# Patient Record
Sex: Female | Born: 1950 | Race: White | Hispanic: No | Smoking: Never smoker
Health system: Southern US, Community
[De-identification: ages and names within clinical notes are randomized; demographics above are authoritative.]

## PROBLEM LIST (undated history)

## (undated) DIAGNOSIS — E7800 Pure hypercholesterolemia, unspecified: Secondary | ICD-10-CM

## (undated) DIAGNOSIS — D649 Anemia, unspecified: Secondary | ICD-10-CM

## (undated) DIAGNOSIS — K635 Polyp of colon: Secondary | ICD-10-CM

## (undated) DIAGNOSIS — M8580 Other specified disorders of bone density and structure, unspecified site: Secondary | ICD-10-CM

## (undated) DIAGNOSIS — Z87442 Personal history of urinary calculi: Secondary | ICD-10-CM

## (undated) DIAGNOSIS — Z780 Asymptomatic menopausal state: Secondary | ICD-10-CM

## (undated) DIAGNOSIS — F419 Anxiety disorder, unspecified: Secondary | ICD-10-CM

## (undated) DIAGNOSIS — N200 Calculus of kidney: Secondary | ICD-10-CM

## (undated) DIAGNOSIS — M1990 Unspecified osteoarthritis, unspecified site: Secondary | ICD-10-CM

## (undated) DIAGNOSIS — F32A Depression, unspecified: Secondary | ICD-10-CM

## (undated) DIAGNOSIS — G252 Other specified forms of tremor: Secondary | ICD-10-CM

## (undated) HISTORY — PX: COLONOSCOPY: SHX174

## (undated) HISTORY — DX: Unspecified osteoarthritis, unspecified site: M19.90

## (undated) HISTORY — PX: LEG SURGERY: SHX1003

## (undated) HISTORY — DX: Asymptomatic menopausal state: Z78.0

## (undated) HISTORY — DX: Other specified disorders of bone density and structure, unspecified site: M85.80

## (undated) HISTORY — DX: Polyp of colon: K63.5

## (undated) HISTORY — DX: Pure hypercholesterolemia, unspecified: E78.00

## (undated) HISTORY — DX: Other specified forms of tremor: G25.2

## (undated) HISTORY — DX: Calculus of kidney: N20.0

---

## 1998-08-14 ENCOUNTER — Other Ambulatory Visit: Admission: RE | Admit: 1998-08-14 | Discharge: 1998-08-14 | Payer: Self-pay | Admitting: Gynecology

## 1999-08-15 ENCOUNTER — Other Ambulatory Visit: Admission: RE | Admit: 1999-08-15 | Discharge: 1999-08-15 | Payer: Self-pay | Admitting: Gynecology

## 2000-09-10 ENCOUNTER — Other Ambulatory Visit: Admission: RE | Admit: 2000-09-10 | Discharge: 2000-09-10 | Payer: Self-pay | Admitting: Gynecology

## 2001-09-08 ENCOUNTER — Other Ambulatory Visit: Admission: RE | Admit: 2001-09-08 | Discharge: 2001-09-08 | Payer: Self-pay | Admitting: Gynecology

## 2002-09-22 ENCOUNTER — Other Ambulatory Visit: Admission: RE | Admit: 2002-09-22 | Discharge: 2002-09-22 | Payer: Self-pay | Admitting: Gynecology

## 2003-09-13 ENCOUNTER — Other Ambulatory Visit: Admission: RE | Admit: 2003-09-13 | Discharge: 2003-09-13 | Payer: Self-pay | Admitting: Gynecology

## 2004-09-16 ENCOUNTER — Other Ambulatory Visit: Admission: RE | Admit: 2004-09-16 | Discharge: 2004-09-16 | Payer: Self-pay | Admitting: Gynecology

## 2005-09-18 ENCOUNTER — Other Ambulatory Visit: Admission: RE | Admit: 2005-09-18 | Discharge: 2005-09-18 | Payer: Self-pay | Admitting: Gynecology

## 2006-09-29 ENCOUNTER — Other Ambulatory Visit: Admission: RE | Admit: 2006-09-29 | Discharge: 2006-09-29 | Payer: Self-pay | Admitting: Gynecology

## 2007-10-20 ENCOUNTER — Other Ambulatory Visit: Admission: RE | Admit: 2007-10-20 | Discharge: 2007-10-20 | Payer: Self-pay | Admitting: Gynecology

## 2010-09-15 ENCOUNTER — Encounter: Payer: Self-pay | Admitting: Endocrinology

## 2010-12-09 ENCOUNTER — Ambulatory Visit: Payer: Self-pay | Admitting: Internal Medicine

## 2010-12-23 ENCOUNTER — Other Ambulatory Visit: Payer: Self-pay

## 2010-12-23 ENCOUNTER — Ambulatory Visit (HOSPITAL_BASED_OUTPATIENT_CLINIC_OR_DEPARTMENT_OTHER)
Admission: RE | Admit: 2010-12-23 | Discharge: 2010-12-23 | Disposition: A | Discharge status: Home / Self Care | Payer: BC Managed Care – PPO | Source: Ambulatory Visit | Attending: Internal Medicine | Admitting: Internal Medicine

## 2010-12-23 ENCOUNTER — Other Ambulatory Visit: Payer: Self-pay | Admitting: Internal Medicine

## 2010-12-23 ENCOUNTER — Ambulatory Visit (INDEPENDENT_AMBULATORY_CARE_PROVIDER_SITE_OTHER): Payer: BC Managed Care – PPO | Admitting: Internal Medicine

## 2010-12-23 DIAGNOSIS — M949 Disorder of cartilage, unspecified: Secondary | ICD-10-CM

## 2010-12-23 DIAGNOSIS — R011 Cardiac murmur, unspecified: Secondary | ICD-10-CM

## 2010-12-23 DIAGNOSIS — M25551 Pain in right hip: Secondary | ICD-10-CM

## 2010-12-23 DIAGNOSIS — Z01419 Encounter for gynecological examination (general) (routine) without abnormal findings: Secondary | ICD-10-CM

## 2010-12-23 DIAGNOSIS — M25559 Pain in unspecified hip: Secondary | ICD-10-CM

## 2010-12-23 DIAGNOSIS — Z113 Encounter for screening for infections with a predominantly sexual mode of transmission: Secondary | ICD-10-CM

## 2010-12-23 DIAGNOSIS — M899 Disorder of bone, unspecified: Secondary | ICD-10-CM

## 2010-12-23 DIAGNOSIS — Z1272 Encounter for screening for malignant neoplasm of vagina: Secondary | ICD-10-CM

## 2011-01-09 ENCOUNTER — Ambulatory Visit (INDEPENDENT_AMBULATORY_CARE_PROVIDER_SITE_OTHER): Payer: BC Managed Care – PPO | Admitting: Internal Medicine

## 2011-01-09 DIAGNOSIS — R011 Cardiac murmur, unspecified: Secondary | ICD-10-CM

## 2011-01-09 DIAGNOSIS — Z23 Encounter for immunization: Secondary | ICD-10-CM

## 2011-01-09 DIAGNOSIS — M159 Polyosteoarthritis, unspecified: Secondary | ICD-10-CM

## 2011-01-09 DIAGNOSIS — E785 Hyperlipidemia, unspecified: Secondary | ICD-10-CM

## 2011-01-15 ENCOUNTER — Ambulatory Visit (HOSPITAL_COMMUNITY)
Admission: RE | Admit: 2011-01-15 | Discharge: 2011-01-15 | Disposition: A | Discharge status: Home / Self Care | Payer: BC Managed Care – PPO | Source: Ambulatory Visit | Attending: Internal Medicine | Admitting: Internal Medicine

## 2011-01-15 DIAGNOSIS — R011 Cardiac murmur, unspecified: Secondary | ICD-10-CM

## 2011-01-15 DIAGNOSIS — I079 Rheumatic tricuspid valve disease, unspecified: Secondary | ICD-10-CM | POA: Insufficient documentation

## 2011-01-15 DIAGNOSIS — I059 Rheumatic mitral valve disease, unspecified: Secondary | ICD-10-CM | POA: Insufficient documentation

## 2011-01-16 ENCOUNTER — Encounter: Payer: Self-pay | Admitting: Internal Medicine

## 2011-01-22 ENCOUNTER — Ambulatory Visit: Payer: BC Managed Care – PPO | Admitting: Physical Therapy

## 2011-03-10 ENCOUNTER — Telehealth: Payer: Self-pay | Admitting: Emergency Medicine

## 2011-03-10 NOTE — Telephone Encounter (Signed)
Spoke with pt.  She states that she was instructed to begin the process of coming off her hormones.  She states that she is currently on 1/8 of the the Vivelle Dot and has been since 6/15.  She states that she has continued to take Prometrium on same schedule.  She wonders if/when she is supposed to stop taking it.  Per DDS, when pt is off Vivelle Dot, she is to stop Prometrium.  When I explained this to [facility], she states that she is already having problematic hot flashes, 12-15 per day, including at night making sleeping difficult.  She states that she thought DDS mentioned something about putting her on "organic, natural hormone"?  Aware per DDS, she needs to make appt to discuss hormones, side effects and the goal of being off hormones completely.  Pt was slightly reluctant to make appt, but did agree to appt tomorrow morning at 11am.

## 2011-03-11 ENCOUNTER — Encounter: Payer: Self-pay | Admitting: Internal Medicine

## 2011-03-11 ENCOUNTER — Ambulatory Visit (INDEPENDENT_AMBULATORY_CARE_PROVIDER_SITE_OTHER): Payer: BC Managed Care – PPO | Admitting: Internal Medicine

## 2011-03-11 ENCOUNTER — Other Ambulatory Visit: Payer: Self-pay | Admitting: Emergency Medicine

## 2011-03-11 VITALS — BP 100/60 | HR 64 | Temp 97.6°F | Resp 12 | Ht 61.0 in | Wt 130.0 lb

## 2011-03-11 DIAGNOSIS — M129 Arthropathy, unspecified: Secondary | ICD-10-CM

## 2011-03-11 DIAGNOSIS — E559 Vitamin D deficiency, unspecified: Secondary | ICD-10-CM

## 2011-03-11 DIAGNOSIS — R251 Tremor, unspecified: Secondary | ICD-10-CM

## 2011-03-11 DIAGNOSIS — M899 Disorder of bone, unspecified: Secondary | ICD-10-CM

## 2011-03-11 DIAGNOSIS — M8580 Other specified disorders of bone density and structure, unspecified site: Secondary | ICD-10-CM

## 2011-03-11 DIAGNOSIS — E785 Hyperlipidemia, unspecified: Secondary | ICD-10-CM

## 2011-03-11 DIAGNOSIS — R259 Unspecified abnormal involuntary movements: Secondary | ICD-10-CM

## 2011-03-11 DIAGNOSIS — Z780 Asymptomatic menopausal state: Secondary | ICD-10-CM

## 2011-03-11 DIAGNOSIS — M1990 Unspecified osteoarthritis, unspecified site: Secondary | ICD-10-CM

## 2011-03-11 DIAGNOSIS — K635 Polyp of colon: Secondary | ICD-10-CM

## 2011-03-11 DIAGNOSIS — D126 Benign neoplasm of colon, unspecified: Secondary | ICD-10-CM

## 2011-03-11 DIAGNOSIS — N951 Menopausal and female climacteric states: Secondary | ICD-10-CM

## 2011-03-11 MED ORDER — VENLAFAXINE HCL 75 MG PO TABS: 75.0000 mg | ORAL_TABLET | Freq: Every day | ORAL | Status: DC

## 2011-03-11 NOTE — Patient Instructions (Signed)
Take med with food.  Call if any problems

## 2011-03-12 ENCOUNTER — Encounter: Payer: Self-pay | Admitting: Internal Medicine

## 2011-03-12 ENCOUNTER — Telehealth: Payer: Self-pay | Admitting: Internal Medicine

## 2011-03-12 ENCOUNTER — Telehealth: Payer: Self-pay | Admitting: Emergency Medicine

## 2011-03-12 DIAGNOSIS — E559 Vitamin D deficiency, unspecified: Secondary | ICD-10-CM | POA: Insufficient documentation

## 2011-03-12 DIAGNOSIS — Z780 Asymptomatic menopausal state: Secondary | ICD-10-CM | POA: Insufficient documentation

## 2011-03-12 DIAGNOSIS — M8580 Other specified disorders of bone density and structure, unspecified site: Secondary | ICD-10-CM | POA: Insufficient documentation

## 2011-03-12 DIAGNOSIS — M1990 Unspecified osteoarthritis, unspecified site: Secondary | ICD-10-CM | POA: Insufficient documentation

## 2011-03-12 DIAGNOSIS — E785 Hyperlipidemia, unspecified: Secondary | ICD-10-CM | POA: Insufficient documentation

## 2011-03-12 DIAGNOSIS — K635 Polyp of colon: Secondary | ICD-10-CM | POA: Insufficient documentation

## 2011-03-12 DIAGNOSIS — R251 Tremor, unspecified: Secondary | ICD-10-CM | POA: Insufficient documentation

## 2011-03-12 NOTE — Telephone Encounter (Signed)
Spoke with pt..  She is feeling better now but does not want Effexor.  I counseled she could try OTC Remifemin 40 mg for 3 days then 80 mg daily for 4-6 months.  If she starts OTC med she is to come to office for LFT"S  6-8 weeks after starting medication.  Pt voices understanding

## 2011-03-12 NOTE — Telephone Encounter (Signed)
I spoke with pt.  She can try otc Remifemin but if she takes daily must have her LFT"S checked in 6-8 weeks.  She voices understanding.  Gavin Pound  Has she had an updated mammogram?  Chart has one from 2010.  Please call pt and she if she scheduled one.  [facility] did her last one  Message back thanks

## 2011-03-12 NOTE — Telephone Encounter (Signed)
Spoke with pt.  She reports what she believes to be a reaction to venlafaxine.  She took first dose last night around 730pm.  She states this morning, she feels like she has "been drinking too much for several days", she is nauseated, dizzy.  She has "jerking" in her legs and feet, blurred vision and her skin feels "prickly".  She denies any rash or fevers, no itching.  She is not having any difficulty breathing.  She states she does not wish to take any more venlafaxine, no more hormones.  She would like to try to "come off all the medication" and "deal with the hot flashes as best I can for as long as I can".  She is aware I will let DDS know and see if she has any further recommendations.

## 2011-03-12 NOTE — Progress Notes (Signed)
  Subjective:    Patient ID: Alexis Holmes, female    DOB: 07-11-1951, 60 y.o.   MRN: [id number]  HPI  Alexis Holmes is here to discuss alternatives to hormones as she is having more hot flushes as she tapers her Vivelle Dot.  She is still on the prometrium.  She  Nursing visit note.  She is cautious and "does not like to take meds."  Night  And day flushes  She also is asking about exercise and its effect on her arthritis.  She does Jazzercise several times a week and likes it.  She does not experience discomfort 24-48hours later in her joints   No Known Allergies Past Medical History  Diagnosis Date  . Hypercholesterolemia   . Vitamin D deficiency   . Intention tremor     Right  . Osteopenia   . Menopause   . Colon polyps   . Arthritis    Past Surgical History  Procedure Date  . Leg surgery 1988-1990    s/p fracture   History   Social History  . Marital Status: Divorced    Spouse Name: N/A    Number of Children: 0  . Years of Education: Assoc deg   Occupational History  . [employer]    Social History Main Topics  . Smoking status: Never Smoker   . Smokeless tobacco: Never Used  . Alcohol Use: No  . Drug Use: No  . Sexually Active: Yes -- Female partner(s)   Other Topics Concern  . Not on file   Social History Narrative  . No narrative on file   Family History  Problem Relation Age of Onset  . Fibromyalgia Brother   . Diabetes Maternal Grandfather   . Breast cancer Maternal Aunt    There is no problem list on file for this patient.      Review of Systems  No rashes, no report of vaginal dryness.  See HPI     Objective:   Physical Exam  Constitutional: She is oriented to person, place, and time. She appears well-developed and well-nourished.  Cardiovascular: Normal rate and regular rhythm.  Exam reveals no gallop and no friction rub.   No murmur heard.      No LE edema  Pulmonary/Chest: Breath sounds normal. She has no wheezes. She has no rales. She  exhibits no tenderness.  Neurological: She is alert and oriented to person, place, and time.  Skin: Skin is warm and dry.  Psychiatric: She has a normal mood and affect. Her behavior is normal.          Assessment & Plan:   1)  Symptomatic menopause:  I counseled for 30 mins regarding options of Effexor or Clonidine by prescription or OTC Remifemin for a few months.  She wishes to try a short course of Effexor.  She is aware of neurologic and GI, and sexual side effects.  She is to call if any problems.  2)  Arthritis:  Encourage low impact exercise for her joints.  She can use Voltaren gel prn See problem list

## 2011-03-13 NOTE — Telephone Encounter (Signed)
Spoke with pt.  She states that she is feeling much better today.  She states symptoms have significantly improved, not completely resolved.  She still has some minor dizziness, but much better than yesterday.  Reminded patient she is due for mammogram next month, she states she will schedule.

## 2011-03-13 NOTE — Telephone Encounter (Signed)
Chart shows mammogram due 8/12.  [facility] does not have a pending appt.

## 2011-04-15 ENCOUNTER — Inpatient Hospital Stay (INDEPENDENT_AMBULATORY_CARE_PROVIDER_SITE_OTHER)
Admission: RE | Admit: 2011-04-15 | Discharge: 2011-04-15 | Disposition: A | Discharge status: Home / Self Care | Payer: BC Managed Care – PPO | Source: Ambulatory Visit | Attending: Emergency Medicine | Admitting: Emergency Medicine

## 2011-04-15 ENCOUNTER — Encounter: Payer: Self-pay | Admitting: Emergency Medicine

## 2011-04-15 DIAGNOSIS — J209 Acute bronchitis, unspecified: Secondary | ICD-10-CM | POA: Insufficient documentation

## 2011-07-28 NOTE — Progress Notes (Signed)
Summary: CHEST CONGESTION...WSE   Vital Signs:  Patient Profile:   60 Years Old Female CC:      cold & URI symptoms x 3-4 days Height:     61 inches Weight:      130.75 pounds O2 Sat:      98 % O2 treatment:    Room Air Temp:     98.3 degrees F oral Pulse rate:   67 / minute Resp:     16 per minute BP sitting:   116 / 71  (left arm) Cuff size:   regular  Vitals Entered By: Clemens Catholic LPN (April 15, 2011 6:40 PM)                  Updated Prior Medication List: No Medications Current Allergies: No known allergies History of Present Illness History from: patient Chief Complaint: cold & URI symptoms x 3-4 days History of Present Illness: 60 Years Old Female complains of onset of cold symptoms for 3-4 days.  Adlene has been usingno OTC meds. No sore throat + cough No pleuritic pain No wheezing No nasal congestion No post-nasal drainage No sinus pain/pressure + chest congestion No itchy/red eyes No earache No hemoptysis No SOB No chills/sweats No fever No nausea No vomiting No abdominal pain No diarrhea No skin rashes No fatigue No myalgias No headache   REVIEW OF SYSTEMS Constitutional Symptoms      Denies fever, chills, night sweats, weight loss, weight gain, and fatigue.  Eyes       Denies change in vision, eye pain, eye discharge, glasses, contact lenses, and eye surgery. Ear/Nose/Throat/Mouth       Denies hearing loss/aids, change in hearing, ear pain, ear discharge, dizziness, frequent runny nose, frequent nose bleeds, sinus problems, sore throat, hoarseness, and tooth pain or bleeding.  Respiratory       Complains of productive cough and bronchitis.      Denies dry cough, wheezing, shortness of breath, asthma, and emphysema/COPD.  Cardiovascular       Denies murmurs, chest pain, and tires easily with exhertion.    Gastrointestinal       Denies stomach pain, nausea/vomiting, diarrhea, constipation, blood in bowel movements, and  indigestion. Genitourniary       Denies painful urination, kidney stones, and loss of urinary control. Neurological       Denies paralysis, seizures, and fainting/blackouts. Musculoskeletal       Denies muscle pain, joint pain, joint stiffness, decreased range of motion, redness, swelling, muscle weakness, and gout.  Skin       Denies bruising, unusual mles/lumps or sores, and hair/skin or nail changes.  Psych       Denies mood changes, temper/anger issues, anxiety/stress, speech problems, depression, and sleep problems. Other Comments: pt c/o chest congestion, productive cough (yellow) x 3-4 days. no fever. no OTC meds.   Past History:  Past Medical History: Unremarkable  Past Surgical History: 2 surgeries for fractured leg 88-89'  Family History: none  Social History: Never Smoked Alcohol use-no Drug use-no Smoking Status:  never Drug Use:  no Physical Exam General appearance: well developed, well nourished, no acute distress Ears: normal, no lesions or deformities Nasal: mucosa pink, nonedematous, no septal deviation, turbinates normal Oral/Pharynx: tongue normal, posterior pharynx without erythema or exudate Chest/Lungs: no rales, wheezes, or rhonchi bilateral, breath sounds equal without effort Heart: regular rate and  rhythm, no murmur MSE: oriented to time, place, and person Assessment New Problems: BRONCHITIS, ACUTE (ICD-466.0)   Plan  New Medications/Changes: ZITHROMAX Z-PAK 250 MG TABS (AZITHROMYCIN) use as directed  #1 x 0, 04/15/2011, Hoyt Koch MD  New Orders: Pulse Oximetry (single measurment) [phone] New Patient Level II [99202] Planning Comments:   1)  Take the prescribed antibiotic as instructed. 2)  Use nasal saline solution (over the counter) at least 3 times a day. 3)  Use over the counter decongestants like Zyrtec-D every 12 hours as needed to help with congestion. 4)  Can take tylenol every 6 hours or motrin every 8 hours for pain or  fever. 5)  Follow up with your primary doctor  if no improvement in 5-7 days, sooner if increasing pain, fever, or new symptoms.    The patient and/or caregiver has been counseled thoroughly with regard to medications prescribed including dosage, schedule, interactions, rationale for use, and possible side effects and they verbalize understanding.  Diagnoses and expected course of recovery discussed and will return if not improved as expected or if the condition worsens. Patient and/or caregiver verbalized understanding.  Prescriptions: ZITHROMAX Z-PAK 250 MG TABS (AZITHROMYCIN) use as directed  #1 x 0   Entered and Authorized by:   Hoyt Koch MD   Signed by:   Hoyt Koch MD on 04/15/2011   Method used:   Print then Give to Patient   RxID:   [id number]   Orders Added: 1)  Pulse Oximetry (single measurment) [94760] 2)  New Patient Level II [57846]

## 2012-03-17 ENCOUNTER — Encounter: Payer: Self-pay | Admitting: Internal Medicine

## 2016-11-26 ENCOUNTER — Other Ambulatory Visit: Payer: Self-pay | Admitting: Obstetrics & Gynecology

## 2016-11-26 DIAGNOSIS — R319 Hematuria, unspecified: Secondary | ICD-10-CM

## 2016-11-26 DIAGNOSIS — R1084 Generalized abdominal pain: Secondary | ICD-10-CM

## 2016-11-27 ENCOUNTER — Ambulatory Visit
Admission: RE | Admit: 2016-11-27 | Discharge: 2016-11-27 | Disposition: A | Discharge status: Home / Self Care | Payer: 59 | Source: Ambulatory Visit | Attending: Obstetrics & Gynecology | Admitting: Obstetrics & Gynecology

## 2016-11-27 DIAGNOSIS — R319 Hematuria, unspecified: Secondary | ICD-10-CM

## 2016-11-27 DIAGNOSIS — R1084 Generalized abdominal pain: Secondary | ICD-10-CM

## 2016-11-27 MED ORDER — IOPAMIDOL (ISOVUE-300) INJECTION 61%
100.0000 mL | Freq: Once | INTRAVENOUS | Status: AC | PRN
  Administered 2016-11-27: 100 mL via INTRAVENOUS

## 2018-01-25 ENCOUNTER — Other Ambulatory Visit: Payer: Self-pay | Admitting: Internal Medicine

## 2018-01-25 DIAGNOSIS — R911 Solitary pulmonary nodule: Secondary | ICD-10-CM

## 2018-01-29 ENCOUNTER — Other Ambulatory Visit: Payer: 59

## 2018-02-01 ENCOUNTER — Ambulatory Visit
Admission: RE | Admit: 2018-02-01 | Discharge: 2018-02-01 | Disposition: A | Discharge status: Home / Self Care | Payer: 59 | Source: Ambulatory Visit | Attending: Internal Medicine | Admitting: Internal Medicine

## 2018-02-01 DIAGNOSIS — R911 Solitary pulmonary nodule: Secondary | ICD-10-CM

## 2018-02-01 MED ORDER — IOPAMIDOL (ISOVUE-300) INJECTION 61%
75.0000 mL | Freq: Once | INTRAVENOUS | Status: AC | PRN
  Administered 2018-02-01: 75 mL via INTRAVENOUS

## 2019-06-27 DIAGNOSIS — R310 Gross hematuria: Secondary | ICD-10-CM | POA: Diagnosis not present

## 2019-06-27 DIAGNOSIS — N200 Calculus of kidney: Secondary | ICD-10-CM | POA: Diagnosis not present

## 2019-06-27 DIAGNOSIS — N201 Calculus of ureter: Secondary | ICD-10-CM | POA: Diagnosis not present

## 2019-07-29 DIAGNOSIS — R29890 Loss of height: Secondary | ICD-10-CM | POA: Diagnosis not present

## 2019-07-29 DIAGNOSIS — Z8262 Family history of osteoporosis: Secondary | ICD-10-CM | POA: Diagnosis not present

## 2019-07-29 DIAGNOSIS — M8589 Other specified disorders of bone density and structure, multiple sites: Secondary | ICD-10-CM | POA: Diagnosis not present

## 2019-08-08 DIAGNOSIS — M24541 Contracture, right hand: Secondary | ICD-10-CM | POA: Diagnosis not present

## 2019-08-08 DIAGNOSIS — M79644 Pain in right finger(s): Secondary | ICD-10-CM | POA: Diagnosis not present

## 2019-08-23 DIAGNOSIS — Z13220 Encounter for screening for lipoid disorders: Secondary | ICD-10-CM | POA: Diagnosis not present

## 2019-08-23 DIAGNOSIS — Z130 Encounter for screening for diseases of the blood and blood-forming organs and certain disorders involving the immune mechanism: Secondary | ICD-10-CM | POA: Diagnosis not present

## 2019-08-23 DIAGNOSIS — Z1329 Encounter for screening for other suspected endocrine disorder: Secondary | ICD-10-CM | POA: Diagnosis not present

## 2019-08-23 DIAGNOSIS — Z0000 Encounter for general adult medical examination without abnormal findings: Secondary | ICD-10-CM | POA: Diagnosis not present

## 2019-08-23 DIAGNOSIS — M899 Disorder of bone, unspecified: Secondary | ICD-10-CM | POA: Diagnosis not present

## 2019-09-26 DIAGNOSIS — M8580 Other specified disorders of bone density and structure, unspecified site: Secondary | ICD-10-CM | POA: Diagnosis not present

## 2019-09-26 DIAGNOSIS — E7800 Pure hypercholesterolemia, unspecified: Secondary | ICD-10-CM | POA: Diagnosis not present

## 2019-09-26 DIAGNOSIS — Z0000 Encounter for general adult medical examination without abnormal findings: Secondary | ICD-10-CM | POA: Diagnosis not present

## 2019-12-27 DIAGNOSIS — Z0000 Encounter for general adult medical examination without abnormal findings: Secondary | ICD-10-CM | POA: Diagnosis not present

## 2019-12-27 DIAGNOSIS — M8580 Other specified disorders of bone density and structure, unspecified site: Secondary | ICD-10-CM | POA: Diagnosis not present

## 2019-12-27 DIAGNOSIS — E7800 Pure hypercholesterolemia, unspecified: Secondary | ICD-10-CM | POA: Diagnosis not present

## 2020-02-07 DIAGNOSIS — M65331 Trigger finger, right middle finger: Secondary | ICD-10-CM | POA: Diagnosis not present

## 2020-02-07 DIAGNOSIS — N201 Calculus of ureter: Secondary | ICD-10-CM | POA: Diagnosis not present

## 2020-02-13 DIAGNOSIS — N201 Calculus of ureter: Secondary | ICD-10-CM | POA: Diagnosis not present

## 2020-02-13 DIAGNOSIS — N132 Hydronephrosis with renal and ureteral calculous obstruction: Secondary | ICD-10-CM | POA: Diagnosis not present

## 2020-02-15 ENCOUNTER — Other Ambulatory Visit: Payer: Self-pay | Admitting: Urology

## 2020-02-16 ENCOUNTER — Encounter (HOSPITAL_BASED_OUTPATIENT_CLINIC_OR_DEPARTMENT_OTHER): Payer: Self-pay | Admitting: Urology

## 2020-02-16 ENCOUNTER — Other Ambulatory Visit (HOSPITAL_COMMUNITY)
Admission: RE | Admit: 2020-02-16 | Discharge: 2020-02-16 | Disposition: A | Discharge status: Home / Self Care | Payer: BC Managed Care – PPO | Source: Ambulatory Visit | Attending: Urology | Admitting: Urology

## 2020-02-16 DIAGNOSIS — Z01812 Encounter for preprocedural laboratory examination: Secondary | ICD-10-CM | POA: Insufficient documentation

## 2020-02-16 DIAGNOSIS — Z20822 Contact with and (suspected) exposure to covid-19: Secondary | ICD-10-CM | POA: Diagnosis not present

## 2020-02-16 LAB — SARS CORONAVIRUS 2 (TAT 6-24 HRS): SARS Coronavirus 2: NEGATIVE

## 2020-02-16 NOTE — Progress Notes (Signed)
Patient to arrive at 0800 on 02/20/2020. History and medications reviewed. Instructed to stop Ibuprofen 48 hours prior to procedure. All instructions reviewed. Driver secured.

## 2020-02-17 NOTE — H&P (Signed)
Office Visit Report     02/13/2020   --------------------------------------------------------------------------------   Alexis Holmes  MRN: [id number]  DOB: 1951/08/06, 69 year old Female  SSN:    PRIMARY CARE:    REFERRING:    PROVIDER:  Jacalyn Lefevre, M.D.  LOCATION:  [facility] [phone]     --------------------------------------------------------------------------------   CC/HPI: cc: follow up CT   02/13/20: 69 year old woman who was initially seen in November 2020 with gross hematuria found to have a 4 mm left UPJ calculus. She was supposed to return with a KUB 2 weeks later however did not come back. She reports intermittent severe left flank pain and does not feel like she has passed any stone. Patient's urinalysis today showed 20-40 red blood cells per high-powered field and calcium oxalate crystals. Patient denies fever, chills, nausea or vomiting. Her main complaint is left flank pain.   Today she returns after CT of the abdomen/pelvis is done. CT shows an 11 mm left UPJ calculus. Patient is not on any blood thinners. Stone is easily seen on CT scout film.     ALLERGIES: None   MEDICATIONS: Alendronate Sodium  B Complex  Biotin  Duloxetine Hcl  Estradiol  Fish Oil  Multivitamin  Progesterone  Turmeric  Vitamin C     GU PSH: None   NON-GU PSH: Leg surgery (unspecified)     GU PMH: Microscopic hematuria, Patient has persistent microscopic hematuria. Given the left flank pain and known stone seen on imaging in November will repeat CT scan of the abdomen pelvis. Clear ureteral calculus not seen today and this will help [place] get better evaluation overall stone burden. - 02/07/2020 Gross hematuria, Her gross hematuria is clearly secondary to the stone located at the UPJ on the left-hand side. - 06/27/2019 Ureteral calculus, Left, After discussing the options she has elected to proceed with medical expulsive therapy and therefore of placed her on  tamsulosin and she will follow up in 2 weeks for a KUB and sooner should she have any worsening pain. - 06/27/2019      PMH Notes: Gross hematuria: She underwent evaluation for gross hematuria by Dr. Barnie Del in 5/18. At that time a CT scan had revealed the single stone in the lower pole of her left kidney. Cystoscopy was negative.   Calculus disease: She has a history of calculus disease. A CT scan in 4/18 revealed a 4 mm stone in the lower pole of the left kidney with no right renal calculi.   Renal cyst: Bilateral simple cysts were identified on CT scan in 4/18.   NON-GU PMH: Cardiac murmur, unspecified Hypercholesterolemia    FAMILY HISTORY: Breast Cancer - Aunt Diabetes - Grandfather gangrene - Mother   SOCIAL HISTORY: Marital Status: Divorced Preferred Language: English; Ethnicity: Not Hispanic Or Latino; Race: White Current Smoking Status: Patient has never smoked.   Tobacco Use Assessment Completed: Used Tobacco in last 30 days? Does not drink anymore.  Drinks 2 caffeinated drinks per day.    REVIEW OF SYSTEMS:    GU Review Female:   Patient denies frequent urination, hard to postpone urination, burning /pain with urination, get up at night to urinate, leakage of urine, stream starts and stops, trouble starting your stream, have to strain to urinate, and being pregnant.  Gastrointestinal (Upper):   Patient denies nausea, vomiting, and indigestion/ heartburn.  Gastrointestinal (Lower):   Patient denies diarrhea and constipation.  Constitutional:   Patient denies fever, night sweats, weight loss, and fatigue.  Skin:   Patient denies skin rash/ lesion and itching.  Eyes:   Patient denies blurred vision and double vision.  Ears/ Nose/ Throat:   Patient denies sore throat and sinus problems.  Hematologic/Lymphatic:   Patient denies swollen glands and easy bruising.  Cardiovascular:   Patient denies leg swelling and chest pains.  Respiratory:   Patient denies cough and shortness  of breath.  Endocrine:   Patient denies excessive thirst.  Musculoskeletal:   Patient denies back pain and joint pain.  Neurological:   Patient denies headaches and dizziness.  Psychologic:   Patient denies depression and anxiety.   VITAL SIGNS:      02/13/2020 01:49 PM  Weight 137 lb / 62.14 kg  Height 61 in / 154.94 cm  BP 131/79 mmHg  Pulse 66 /min  Temperature 97.5 F / 36.3 C  BMI 25.9 kg/m   MULTI-SYSTEM PHYSICAL EXAMINATION:    Constitutional: Well-nourished. No physical deformities. Normally developed. Good grooming.  Neck: Neck symmetrical, not swollen. Normal tracheal position.  Respiratory: No labored breathing, no use of accessory muscles.   Cardiovascular: Normal temperature  Skin: No paleness, no jaundice, no cyanosis. No lesion, no ulcer, no rash.  Neurologic / Psychiatric: Oriented to time, oriented to place, oriented to person. No depression, no anxiety, no agitation.  Gastrointestinal: No rigidity, non obese abdomen.   Eyes: Normal conjunctivae. Normal eyelids.  Ears, Nose, Mouth, and Throat: Left ear no scars, no lesions, no masses. Right ear no scars, no lesions, no masses. Nose no scars, no lesions, no masses. Normal hearing. Normal lips.  Musculoskeletal: Normal gait and station of head and neck.     Complexity of Data:  Urine Test Review:   Urinalysis  X-Ray Review: C.T. Abdomen/Pelvis: Reviewed Films. Discussed With Patient.     PROCEDURES:         C.T. ABD-Pelv w/o - 28768      Patient confirmed No Neulasta OnPro Device.   ASSESSMENT:      ICD-10 Details  1 GU:   Ureteral calculus - N20.1 Left, Chronic, Worsening - Reviewed CT the abdomen pelvis which shows 11 mm left renal pelvis calculus. Given the size it likely will not pass on its own. Patient is experiencing pain and we discussed management options including ESWL versus ureteroscopic laser lithotripsy/stent. Patient wishes to proceed with ESWL. We discussed how the procedure performed as well as  risks and benefits of the procedure. This will be scheduled for the next available date.   PLAN:           Document Letter(s):  Created for Patient: Clinical Summary         Next Appointment:      Next Appointment: 02/23/2020 01:45 PM    Appointment Type: Office Visit Established Patient    Location: [facility] [phone] [id number]    Provider: Jacalyn Lefevre, M.D.    Reason for Visit: next available appt      * Signed by Jacalyn Lefevre, M.D. on 02/13/20 at 2:00 PM (EDT)*     The information contained in this medical record document is considered private and confidential patient information. This information can only be used for the medical diagnosis and/or medical services that are being provided by the patient's selected caregivers. This information can only be distributed outside

## 2020-02-20 ENCOUNTER — Ambulatory Visit (HOSPITAL_COMMUNITY): Payer: BC Managed Care – PPO

## 2020-02-20 ENCOUNTER — Other Ambulatory Visit: Payer: Self-pay

## 2020-02-20 ENCOUNTER — Encounter (HOSPITAL_BASED_OUTPATIENT_CLINIC_OR_DEPARTMENT_OTHER)
Admission: RE | Disposition: A | Discharge status: Home / Self Care | Payer: Self-pay | Source: Home / Self Care | Attending: Urology

## 2020-02-20 ENCOUNTER — Encounter (HOSPITAL_BASED_OUTPATIENT_CLINIC_OR_DEPARTMENT_OTHER): Payer: Self-pay | Admitting: Urology

## 2020-02-20 ENCOUNTER — Ambulatory Visit (HOSPITAL_BASED_OUTPATIENT_CLINIC_OR_DEPARTMENT_OTHER)
Admission: RE | Admit: 2020-02-20 | Discharge: 2020-02-20 | Disposition: A | Discharge status: Home / Self Care | Payer: BC Managed Care – PPO | Attending: Urology | Admitting: Urology

## 2020-02-20 DIAGNOSIS — Z803 Family history of malignant neoplasm of breast: Secondary | ICD-10-CM | POA: Insufficient documentation

## 2020-02-20 DIAGNOSIS — Z01818 Encounter for other preprocedural examination: Secondary | ICD-10-CM | POA: Diagnosis not present

## 2020-02-20 DIAGNOSIS — N200 Calculus of kidney: Secondary | ICD-10-CM | POA: Diagnosis not present

## 2020-02-20 DIAGNOSIS — Z833 Family history of diabetes mellitus: Secondary | ICD-10-CM | POA: Insufficient documentation

## 2020-02-20 HISTORY — PX: EXTRACORPOREAL SHOCK WAVE LITHOTRIPSY: SHX1557

## 2020-02-20 SURGERY — LITHOTRIPSY, ESWL
Anesthesia: LOCAL | Laterality: Left

## 2020-02-20 MED ORDER — CIPROFLOXACIN HCL 500 MG PO TABS
500.0000 mg | ORAL_TABLET | ORAL | Status: AC
  Administered 2020-02-20: 500 mg via ORAL

## 2020-02-20 MED ORDER — DIPHENHYDRAMINE HCL 25 MG PO CAPS
ORAL_CAPSULE | ORAL | Status: AC
  Filled 2020-02-20: qty 1

## 2020-02-20 MED ORDER — TAMSULOSIN HCL 0.4 MG PO CAPS
0.4000 mg | ORAL_CAPSULE | Freq: Every day | ORAL | 0 refills | Status: DC
Start: 2020-02-20 — End: 2021-03-06

## 2020-02-20 MED ORDER — DIPHENHYDRAMINE HCL 25 MG PO CAPS
25.0000 mg | ORAL_CAPSULE | ORAL | Status: AC
  Administered 2020-02-20: 25 mg via ORAL

## 2020-02-20 MED ORDER — DIAZEPAM 5 MG PO TABS
ORAL_TABLET | ORAL | Status: AC
  Filled 2020-02-20: qty 2

## 2020-02-20 MED ORDER — CIPROFLOXACIN HCL 500 MG PO TABS
ORAL_TABLET | ORAL | Status: AC
  Filled 2020-02-20: qty 1

## 2020-02-20 MED ORDER — DIAZEPAM 5 MG PO TABS
10.0000 mg | ORAL_TABLET | ORAL | Status: AC
  Administered 2020-02-20: 10 mg via ORAL

## 2020-02-20 MED ORDER — SODIUM CHLORIDE 0.9 % IV SOLN: INTRAVENOUS | Status: DC

## 2020-02-20 NOTE — Op Note (Signed)
See [facility] operative note scanned into chart. Also because of the size, density, location and other factors that cannot be anticipated I feel this will likely be a staged procedure. This fact supersedes any indication in the scanned [facility] operative note to the contrary.  

## 2020-02-20 NOTE — Discharge Instructions (Addendum)
1. You should strain your urine and collect all fragments and bring them to your follow up appointment.  °2. You should take your pain medication as needed.  Please call if your pain is severe to the point that it is not controlled with your pain medication. °3. You should call if you develop fever > 101 or persistent nausea or vomiting. °4. Your doctor may prescribe tamsulosin to take to help facilitate stone passage. °

## 2020-02-20 NOTE — Interval H&P Note (Signed)
History and Physical Interval Note:  02/20/2020 10:18 AM  Alexis Holmes  has presented today for surgery, with the diagnosis of LEFT RENAL CALCULUS.  The various methods of treatment have been discussed with the patient and family. After consideration of risks, benefits and other options for treatment, the patient has consented to  Procedure(s): EXTRACORPOREAL SHOCK WAVE LITHOTRIPSY (ESWL) (Left) as a surgical intervention.  The patient's history has been reviewed, patient examined, no change in status, stable for surgery.  I have reviewed the patient's chart and labs.  Questions were answered to the patient's satisfaction.     [employer]

## 2020-02-21 ENCOUNTER — Encounter (HOSPITAL_BASED_OUTPATIENT_CLINIC_OR_DEPARTMENT_OTHER): Payer: Self-pay | Admitting: Urology

## 2020-02-21 DIAGNOSIS — R8271 Bacteriuria: Secondary | ICD-10-CM | POA: Diagnosis not present

## 2020-02-21 DIAGNOSIS — R1084 Generalized abdominal pain: Secondary | ICD-10-CM | POA: Diagnosis not present

## 2020-02-21 DIAGNOSIS — N201 Calculus of ureter: Secondary | ICD-10-CM | POA: Diagnosis not present

## 2020-02-21 DIAGNOSIS — R310 Gross hematuria: Secondary | ICD-10-CM | POA: Diagnosis not present

## 2020-03-12 DIAGNOSIS — N132 Hydronephrosis with renal and ureteral calculous obstruction: Secondary | ICD-10-CM | POA: Diagnosis not present

## 2020-03-12 DIAGNOSIS — N201 Calculus of ureter: Secondary | ICD-10-CM | POA: Diagnosis not present

## 2020-06-27 DIAGNOSIS — Z1231 Encounter for screening mammogram for malignant neoplasm of breast: Secondary | ICD-10-CM | POA: Diagnosis not present

## 2020-06-27 DIAGNOSIS — Z6826 Body mass index (BMI) 26.0-26.9, adult: Secondary | ICD-10-CM | POA: Diagnosis not present

## 2020-06-27 DIAGNOSIS — Z0000 Encounter for general adult medical examination without abnormal findings: Secondary | ICD-10-CM | POA: Diagnosis not present

## 2020-06-27 DIAGNOSIS — E7800 Pure hypercholesterolemia, unspecified: Secondary | ICD-10-CM | POA: Diagnosis not present

## 2020-06-27 DIAGNOSIS — Z01419 Encounter for gynecological examination (general) (routine) without abnormal findings: Secondary | ICD-10-CM | POA: Diagnosis not present

## 2020-06-27 DIAGNOSIS — Z1329 Encounter for screening for other suspected endocrine disorder: Secondary | ICD-10-CM | POA: Diagnosis not present

## 2020-06-27 DIAGNOSIS — Z130 Encounter for screening for diseases of the blood and blood-forming organs and certain disorders involving the immune mechanism: Secondary | ICD-10-CM | POA: Diagnosis not present

## 2020-10-02 DIAGNOSIS — M65331 Trigger finger, right middle finger: Secondary | ICD-10-CM | POA: Diagnosis not present

## 2020-10-02 DIAGNOSIS — M79644 Pain in right finger(s): Secondary | ICD-10-CM | POA: Diagnosis not present

## 2020-10-02 DIAGNOSIS — M79641 Pain in right hand: Secondary | ICD-10-CM | POA: Diagnosis not present

## 2020-10-02 DIAGNOSIS — M79642 Pain in left hand: Secondary | ICD-10-CM | POA: Diagnosis not present

## 2020-10-17 DIAGNOSIS — M65331 Trigger finger, right middle finger: Secondary | ICD-10-CM | POA: Diagnosis not present

## 2020-10-29 DIAGNOSIS — M65331 Trigger finger, right middle finger: Secondary | ICD-10-CM | POA: Diagnosis not present

## 2020-10-29 DIAGNOSIS — M25531 Pain in right wrist: Secondary | ICD-10-CM | POA: Diagnosis not present

## 2020-10-31 ENCOUNTER — Other Ambulatory Visit: Payer: Self-pay | Admitting: Orthopedic Surgery

## 2020-10-31 DIAGNOSIS — M25531 Pain in right wrist: Secondary | ICD-10-CM

## 2020-11-01 ENCOUNTER — Ambulatory Visit: Payer: BC Managed Care – PPO | Admitting: Family Medicine

## 2020-11-01 ENCOUNTER — Other Ambulatory Visit: Payer: Self-pay

## 2020-11-01 DIAGNOSIS — M722 Plantar fascial fibromatosis: Secondary | ICD-10-CM

## 2020-11-01 NOTE — Progress Notes (Signed)
   Office Visit Note   Patient: Alexis Holmes           Date of Birth: 1951-02-09           MRN: [id number] Visit Date: 11/01/2020 Requested by: Azucena Fallen, MD [address] [place],  [place] [address] PCP: Azucena Fallen, MD  Subjective: CC: Left Foot Pain  HPI: 70 year old female presenting to clinic with concerns of 4 months of left foot pain.  Patient was given a diagnosis of plantar fasciitis, and states that she has been trying to stretch the foot with little improvement.  She is very active, participating daily in [place] as well as weightlifting classes at the [facility].  She states that her foot pain is worse just anterior to the heel, and is very painful for the first few steps in the morning.  Pain is also worsened when she is taking the first few steps after sitting for prolonged period of time.  She will occasionally take ibuprofen for this which does offer some improvement.  She bought new shoes when this pain initially started, and was given insoles from the foot store-which has not offered any improvement.  Denies any numbness or tingling in the foot.  No weakness in the toes.  States she is otherwise doing well, with no additional concerns today.              ROS:   All other systems were reviewed and are negative.  Objective: Vital Signs: BP 124/82   Ht 5\' 1"  (1.549 m)   Wt 137 lb (62.1 kg)   BMI 25.89 kg/m   Physical Exam:  General:  Alert and oriented, in no acute distress. Pulm:  Breathing unlabored. Psy:  Normal mood, congruent affect. Skin: Bilateral feet with no bruising, rashes, or erythema.  Overlying skin intact.  Left Foot Exam: General assessment: Mildly antalgic gait favoring left foot.  Inspection: Flexible pes cavus with standing.  Normal posterior tibialis function with heel rise. Trace swelling over medial aspect of calcaneus.  Seated Exam: Ankle and toes with full range of motion. Palpation: Endorses tenderness palpation at the anterior aspect of the  calcaneus.  Tenderness along the plantar arch.  Also has pain with heel squeeze.  No pain with calf or Achilles squeeze.  No Tenderness over the peroneal tendons. No bony tenderness to palpation over the 5th metatarsal, navicular, and normal Lisfranc joint without swelling or bruising.   Strength: 5/5 in eversion, inversion, and plantar/dorsiflexion Normal distal sensation   Imaging: No results found.  Assessment & Plan: 70 year old female presented to clinic with left foot pain consistent with plantar fasciitis.  Also with mild pain upon heel squeeze, which I suspect is from her high impact Zumba activities which she does regularly. -We will start conservative care with green sports orthotics to help pad her foot and provide extra arch support. -Recommended Voltaren gel to apply directly to the plantar arch and her pain is significant. -Discussed ice stretches with water bottle -Return to clinic in 4 weeks for reevaluation.  If no improvement at that time, would consider physical therapy. -Patient was happy with today's plan.  She had no further questions or concerns.   I was the preceptor for this visit and available for immediate consultation Shellia Cleverly, DO

## 2020-11-01 NOTE — Patient Instructions (Signed)
You have plantar fasciitis Take tylenol and/or aleve as needed for pain  Plantar fascia stretch for 20-30 seconds (do 3 of these) in morning Lowering/raise on a step exercises 3 x 10 once or twice a day - this is very important for long term recovery. Can add heel walks, toe walks forward and backward as well Ice heel for 15 minutes as needed. Avoid flat shoes/barefoot walking as much as possible. Arch straps have been shown to help with pain. Inserts are important (dr. Zoe Lan active series, spencos, our green insoles, custom orthotics). Steroid injection is a consideration for short term pain relief if you are struggling. Physical therapy is also an option. Follow up with me in 4 weeks.

## 2020-11-16 ENCOUNTER — Other Ambulatory Visit: Payer: Self-pay

## 2020-11-16 ENCOUNTER — Other Ambulatory Visit: Payer: Self-pay | Admitting: Orthopedic Surgery

## 2020-11-16 ENCOUNTER — Ambulatory Visit
Admission: RE | Admit: 2020-11-16 | Discharge: 2020-11-16 | Disposition: A | Discharge status: Home / Self Care | Payer: BC Managed Care – PPO | Source: Ambulatory Visit | Attending: Orthopedic Surgery | Admitting: Orthopedic Surgery

## 2020-11-16 DIAGNOSIS — M25531 Pain in right wrist: Secondary | ICD-10-CM

## 2020-11-16 DIAGNOSIS — S63591A Other specified sprain of right wrist, initial encounter: Secondary | ICD-10-CM | POA: Diagnosis not present

## 2020-11-16 MED ORDER — IOPAMIDOL (ISOVUE-M 200) INJECTION 41%
2.0000 mL | Freq: Once | INTRAMUSCULAR | Status: AC
  Administered 2020-11-16: 2 mL via INTRA_ARTICULAR

## 2020-11-20 DIAGNOSIS — M24131 Other articular cartilage disorders, right wrist: Secondary | ICD-10-CM | POA: Diagnosis not present

## 2020-12-13 ENCOUNTER — Ambulatory Visit: Payer: BC Managed Care – PPO | Admitting: Family Medicine

## 2020-12-13 ENCOUNTER — Other Ambulatory Visit: Payer: Self-pay

## 2020-12-13 VITALS — BP 140/82 | Ht 61.0 in | Wt 137.0 lb

## 2020-12-13 DIAGNOSIS — M722 Plantar fascial fibromatosis: Secondary | ICD-10-CM

## 2020-12-13 NOTE — Progress Notes (Signed)
   Office Visit Note   Patient: Alexis Holmes           Date of Birth: March 28, 1951           MRN: [id number] Visit Date: 12/13/2020 Requested by: Azucena Fallen, MD [address] [place],  [place] [address] PCP: Azucena Fallen, MD  Subjective: CC: Follow up left foot pain  HPI: 70 year old female presenting to clinic today to follow-up on left foot pain, previously diagnosed as plantar fasciitis.  At last appointment, patient was given green heat pad insoles, which she states that she has been happy with.  Overall, patient states that her pain is significantly improved and she is very pleased with her recovery thus far.  She states her pain is "nowhere near as bad" as her previous symptoms, and she will have some days that are completely pain-free.  She also credits this to doing dynamic heel cord stretches, which she found online.  She is back doing Zumba, and is very pleased with this.  He has no additional concerns today.              ROS:   All other systems were reviewed and are negative.  Objective: Vital Signs: BP 140/82   Ht 5\' 1"  (1.549 m)   Wt 137 lb (62.1 kg)   BMI 25.89 kg/m  No flowsheet data found.   No flowsheet data found.  Physical Exam:  General:  Alert and oriented, in no acute distress. Pulm:  Breathing unlabored. Psy:  Normal mood, congruent affect. Skin:  Left Foot without bruises, rashes, or erythema. Overlying skin intact.   Left foot/ankle exam: General assessment: Normal Gait, which is improved from previous. Inspection: Flexible cavus foot with standing.  No significant swelling or deformity.  Seated Exam: Full range of motion in ankles and all toes without any pain.   Palpation: Remains with mild tenderness to palpation at the anterior aspect of calcaneus, which she states is improved from previous exam.  No further tenderness along the plantar arch.  Minimal pain with heel squeeze.  No pain with palpation of midfoot.    Strength: 5/5 in eversion,  inversion, and plantar/dorsiflexion Normal distal sensation   Imaging: No results found.  Assessment & Plan: 70 year old female presenting to clinic for follow-up of left plantar fasciitis.  Overall, patient is very happy with the recovery that she has made thus far.  She was encouraged to continue with her stretches and insulin use in the hopes of continuing this upward trajectory.  She could also consider arch straps should she wish to wear other shoes.  Encouraged to return to clinic if she feels as though she plateaus an additional 3 to 4 weeks.  Otherwise, if she continues to improve no further follow-up is needed.   I was the preceptor for this visit and available for immediate consultation Shellia Cleverly, DO

## 2021-01-09 DIAGNOSIS — E7800 Pure hypercholesterolemia, unspecified: Secondary | ICD-10-CM | POA: Diagnosis not present

## 2021-01-09 DIAGNOSIS — Z0000 Encounter for general adult medical examination without abnormal findings: Secondary | ICD-10-CM | POA: Diagnosis not present

## 2021-01-09 DIAGNOSIS — M8580 Other specified disorders of bone density and structure, unspecified site: Secondary | ICD-10-CM | POA: Diagnosis not present

## 2021-01-10 DIAGNOSIS — H2513 Age-related nuclear cataract, bilateral: Secondary | ICD-10-CM | POA: Diagnosis not present

## 2021-01-10 DIAGNOSIS — H5213 Myopia, bilateral: Secondary | ICD-10-CM | POA: Diagnosis not present

## 2021-01-10 DIAGNOSIS — H35363 Drusen (degenerative) of macula, bilateral: Secondary | ICD-10-CM | POA: Diagnosis not present

## 2021-01-15 DIAGNOSIS — Z0000 Encounter for general adult medical examination without abnormal findings: Secondary | ICD-10-CM | POA: Diagnosis not present

## 2021-01-15 DIAGNOSIS — Z23 Encounter for immunization: Secondary | ICD-10-CM | POA: Diagnosis not present

## 2021-01-15 DIAGNOSIS — E7800 Pure hypercholesterolemia, unspecified: Secondary | ICD-10-CM | POA: Diagnosis not present

## 2021-01-17 ENCOUNTER — Other Ambulatory Visit: Payer: Self-pay | Admitting: Internal Medicine

## 2021-01-17 DIAGNOSIS — E7800 Pure hypercholesterolemia, unspecified: Secondary | ICD-10-CM

## 2021-02-07 ENCOUNTER — Ambulatory Visit
Admission: RE | Admit: 2021-02-07 | Discharge: 2021-02-07 | Disposition: A | Discharge status: Home / Self Care | Payer: No Typology Code available for payment source | Source: Ambulatory Visit | Attending: Internal Medicine | Admitting: Internal Medicine

## 2021-02-07 ENCOUNTER — Other Ambulatory Visit: Payer: BC Managed Care – PPO

## 2021-02-07 DIAGNOSIS — E7800 Pure hypercholesterolemia, unspecified: Secondary | ICD-10-CM

## 2021-03-06 ENCOUNTER — Ambulatory Visit: Payer: BC Managed Care – PPO | Admitting: Family Medicine

## 2021-03-06 ENCOUNTER — Other Ambulatory Visit: Payer: Self-pay

## 2021-03-06 ENCOUNTER — Encounter: Payer: Self-pay | Admitting: Family Medicine

## 2021-03-06 VITALS — BP 122/78 | Ht 61.0 in | Wt 138.0 lb

## 2021-03-06 DIAGNOSIS — M722 Plantar fascial fibromatosis: Secondary | ICD-10-CM | POA: Diagnosis not present

## 2021-03-06 MED ORDER — METHYLPREDNISOLONE ACETATE 40 MG/ML IJ SUSP
40.0000 mg | Freq: Once | INTRAMUSCULAR | Status: AC
  Administered 2021-03-06: 40 mg via INTRA_ARTICULAR

## 2021-03-06 NOTE — Progress Notes (Signed)
PCP: Azucena Fallen, MD  Subjective:   HPI: Patient is a 70 y.o. female here for f/u of left plantar fasciitis.   Last seen on 4/21: and was instructed to continue orthotics/green pad insoles, stretches. At that visit her pain was improving.  Today, patient states her pain has not gotten better over the last few months and over the last 2 weeks her pain has been severe. She is having difficulty ambulating 2/2 to the pain over the last week. Her pain is located in her medial-inferior heel, it does not radiate. It is a sharp, stabbing pain. Her pain is aggravated with first steps in the morning and with any activity that cause heel-strike. She has been unable to wear sandals/flipflops and has been religious with wearing her green pad insoles and doing her home calf stretches daily, but pain persists and worsens. She does use a frozen water bottle over the plantar fascia with some temporary relief. Also, she has had to use Advil over the last few days. Pain today is 5/10, but will get to 8-9/10 with activity.   Past Medical History:  Diagnosis Date   Arthritis    Colon polyps    Hypercholesterolemia    Intention tremor    Right   Menopause    Osteopenia    Vitamin D deficiency     Current Outpatient Medications on File Prior to Visit  Medication Sig Dispense Refill   alendronate (FOSAMAX) 10 MG tablet Take 10 mg by mouth daily before breakfast. Take with a full glass of water on an empty stomach.     DULoxetine (CYMBALTA) 20 MG capsule Take 20 mg by mouth daily.     estradiol (VIVELLE-DOT) 0.0375 MG/24HR Place 1 patch onto the skin 2 (two) times a week.       HYDROcodone-acetaminophen (NORCO/VICODIN) 5-325 MG tablet Take 1 tablet by mouth every 6 (six) hours as needed for moderate pain.     progesterone (PROMETRIUM) 100 MG capsule Take 100 mg by mouth daily.       tamsulosin (FLOMAX) 0.4 MG CAPS capsule Take 1 capsule (0.4 mg total) by mouth at bedtime. 14 capsule 0   venlafaxine  (EFFEXOR) 75 MG tablet Take 1 tablet (75 mg total) by mouth at bedtime. 30 tablet 1   No current facility-administered medications on file prior to visit.    Past Surgical History:  Procedure Laterality Date   EXTRACORPOREAL SHOCK WAVE LITHOTRIPSY Left 02/20/2020   Procedure: EXTRACORPOREAL SHOCK WAVE LITHOTRIPSY (ESWL);  Surgeon: Raynelle Bring, MD;  [facility]: [facility];  Service: Urology;  Laterality: Left;   LEG SURGERY  1988-1990   s/p fracture    No Known Allergies  Social History   Socioeconomic History   Marital status: Divorced    Spouse name: Not on file   Number of children: 0   Years of education: Assoc deg   Highest education level: Not on file  Occupational History   Occupation: [employer]    Employer: [employer]  Tobacco Use   Smoking status: Never   Smokeless tobacco: Never  Substance and Sexual Activity   Alcohol use: No   Drug use: No   Sexual activity: Yes    Partners: Female  Other Topics Concern   Not on file  Social History Narrative   Not on file   Social Determinants of Health   Financial Resource Strain: Not on file  Food Insecurity: Not on file  Transportation Needs: Not on file  Physical  Activity: Not on file  Stress: Not on file  Social Connections: Not on file  Intimate Partner Violence: Not on file    Family History  Problem Relation Age of Onset   Fibromyalgia Brother    Diabetes Maternal Grandfather    Breast cancer Maternal Aunt     BP 122/78   Ht 5\' 1"  (1.549 m)   Wt 138 lb (62.6 kg)   BMI 26.07 kg/m   No flowsheet data found.  No flowsheet data found.  Review of Systems: See HPI above.     Objective:  Physical Exam:  Gen: Well-appearing, in no acute distress; non-toxic CV: Regular Rate. Well-perfused. Warm.  Resp: Breathing unlabored on room air; no wheezing. Psych: Fluid speech in conversation; appropriate affect; normal thought process Neuro: Sensation intact throughout. No gross  coordination deficits.  MSK:  - Left foot/heel: + significant TTP over medial plantar aspect of calcaneus; mild TTP over lateral plantar calcaneus; no erythema, warmth, or skin changes. + mild edema over soft tissue of calcaneus; Normal eversion/inversion stress testing; FROM in all directions; 5/5 strength in all directions. No tenderness along achilles tendon; negative calcaneal squeeze test; + mildly limited dorsiflexion 2/2 pain; + antalgic gait. Negative tinel's sign at heel; no pain with toe flexion   Assessment & Plan:  1. Plantar fasciitis, left foot - worsening. Patient has been dealing with issue since November 2021, failed conservative management with green pad insoles, ice, and heel stretches/home PT. Notable soft tissue edema noted over plantar fascia insertion on bedside U/S today.  - U/S guided corticosteroid injection was performed today - Patient to continue home exercises, icing - Continue with green pad insoles; We did also provide patient with arch strap for support today - May use Advil/Tylenol as needed for pain - We did add eccentric Heel/calf exercises today, discussed with patient - Follow-up as needed or if pain returns   Procedure: U/S-guided Injection - plantar fascia After informed written consent timeout was performed.  Using ultrasound probe, the insertion of left plantar fascia into plantar aspect of medial calcaneus was identified. Soft tissue edema over this area was noted. Once location of maximum tenderness identified, the 25-gauge 1 1/2'' needle was inserted under short-axis U/S guidance. A 2:1 mL combination of 1% plain Lidocaine: depomedrol was injected. A band-aid was applied. This was well tolerated with mild pain and without difficulty.   Elba Barman, DO PGY-4, Sports Medicine Fellow [place]

## 2021-03-06 NOTE — Patient Instructions (Signed)
Plantar Fasciitis:  - Steroid injection performed today, given this 24-48 hrs to see how it feels - Continue frozen water bottle few times a day at least for the next few days for pain control; may take tylenol or ibuprofen as needed - give it about 48 hours, then you may resume your exercises/stretches; add in the eccentric calf stretches as well - wear arch strap on foot for support  - see how it feels in a few weeks, if still painful in a few weeks return

## 2021-06-05 DIAGNOSIS — K5900 Constipation, unspecified: Secondary | ICD-10-CM | POA: Diagnosis not present

## 2021-06-05 DIAGNOSIS — R140 Abdominal distension (gaseous): Secondary | ICD-10-CM | POA: Diagnosis not present

## 2021-08-13 DIAGNOSIS — Z01419 Encounter for gynecological examination (general) (routine) without abnormal findings: Secondary | ICD-10-CM | POA: Diagnosis not present

## 2021-08-13 DIAGNOSIS — Z1231 Encounter for screening mammogram for malignant neoplasm of breast: Secondary | ICD-10-CM | POA: Diagnosis not present

## 2021-08-13 DIAGNOSIS — Z124 Encounter for screening for malignant neoplasm of cervix: Secondary | ICD-10-CM | POA: Diagnosis not present

## 2021-08-13 DIAGNOSIS — Z6826 Body mass index (BMI) 26.0-26.9, adult: Secondary | ICD-10-CM | POA: Diagnosis not present

## 2021-12-02 ENCOUNTER — Encounter: Payer: Self-pay | Admitting: Emergency Medicine

## 2021-12-02 ENCOUNTER — Emergency Department (INDEPENDENT_AMBULATORY_CARE_PROVIDER_SITE_OTHER)
Admission: EM | Admit: 2021-12-02 | Discharge: 2021-12-02 | Disposition: A | Discharge status: Home / Self Care | Payer: PRIVATE HEALTH INSURANCE | Source: Home / Self Care | Attending: Family Medicine | Admitting: Family Medicine

## 2021-12-02 DIAGNOSIS — M7918 Myalgia, other site: Secondary | ICD-10-CM | POA: Diagnosis not present

## 2021-12-02 DIAGNOSIS — S161XXA Strain of muscle, fascia and tendon at neck level, initial encounter: Secondary | ICD-10-CM | POA: Diagnosis not present

## 2021-12-02 MED ORDER — NAPROXEN SODIUM 550 MG PO TABS: 550.0000 mg | ORAL_TABLET | Freq: Two times a day (BID) | ORAL | 0 refills | Status: DC

## 2021-12-02 MED ORDER — CYCLOBENZAPRINE HCL 5 MG PO TABS: 5.0000 mg | ORAL_TABLET | Freq: Every day | ORAL | 0 refills | Status: DC
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
## 2021-12-02 NOTE — Discharge Instructions (Signed)
?  Take the Anaprox 2 times a day with food ?Use warm compresses to area ?Gentle stretching advised 2 times a day ?Take the Flexeril at bedtime ?I have written an order for physical therapy.  They should call you to schedule ?
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
## 2022-01-09 ENCOUNTER — Other Ambulatory Visit (HOSPITAL_BASED_OUTPATIENT_CLINIC_OR_DEPARTMENT_OTHER): Payer: Medicare Other

## 2022-01-09 ENCOUNTER — Encounter (HOSPITAL_BASED_OUTPATIENT_CLINIC_OR_DEPARTMENT_OTHER): Payer: Self-pay

## 2022-01-09 ENCOUNTER — Emergency Department (INDEPENDENT_AMBULATORY_CARE_PROVIDER_SITE_OTHER)
Admission: EM | Admit: 2022-01-09 | Discharge: 2022-01-09 | Disposition: A | Discharge status: Other Acute Inpatient Hospital | Payer: Medicare Other | Source: Home / Self Care | Attending: Family Medicine | Admitting: Family Medicine

## 2022-01-09 ENCOUNTER — Inpatient Hospital Stay (HOSPITAL_BASED_OUTPATIENT_CLINIC_OR_DEPARTMENT_OTHER)
Admission: EM | Admit: 2022-01-09 | Discharge: 2022-01-17 | DRG: 871 | Disposition: A | Discharge status: Home / Self Care | Payer: Medicare Other | Attending: Internal Medicine | Admitting: Internal Medicine

## 2022-01-09 ENCOUNTER — Other Ambulatory Visit: Payer: Self-pay

## 2022-01-09 ENCOUNTER — Inpatient Hospital Stay (HOSPITAL_COMMUNITY): Payer: Medicare Other

## 2022-01-09 ENCOUNTER — Emergency Department (HOSPITAL_BASED_OUTPATIENT_CLINIC_OR_DEPARTMENT_OTHER): Payer: Medicare Other

## 2022-01-09 ENCOUNTER — Emergency Department (INDEPENDENT_AMBULATORY_CARE_PROVIDER_SITE_OTHER): Payer: Medicare Other

## 2022-01-09 DIAGNOSIS — Z79899 Other long term (current) drug therapy: Secondary | ICD-10-CM

## 2022-01-09 DIAGNOSIS — A419 Sepsis, unspecified organism: Principal | ICD-10-CM | POA: Diagnosis present

## 2022-01-09 DIAGNOSIS — Z20822 Contact with and (suspected) exposure to covid-19: Secondary | ICD-10-CM | POA: Diagnosis present

## 2022-01-09 DIAGNOSIS — N179 Acute kidney failure, unspecified: Secondary | ICD-10-CM | POA: Diagnosis present

## 2022-01-09 DIAGNOSIS — E876 Hypokalemia: Secondary | ICD-10-CM | POA: Diagnosis present

## 2022-01-09 DIAGNOSIS — Z7983 Long term (current) use of bisphosphonates: Secondary | ICD-10-CM

## 2022-01-09 DIAGNOSIS — R112 Nausea with vomiting, unspecified: Secondary | ICD-10-CM

## 2022-01-09 DIAGNOSIS — E559 Vitamin D deficiency, unspecified: Secondary | ICD-10-CM | POA: Diagnosis present

## 2022-01-09 DIAGNOSIS — E7800 Pure hypercholesterolemia, unspecified: Secondary | ICD-10-CM | POA: Diagnosis present

## 2022-01-09 DIAGNOSIS — K50012 Crohn's disease of small intestine with intestinal obstruction: Secondary | ICD-10-CM | POA: Diagnosis present

## 2022-01-09 DIAGNOSIS — Z803 Family history of malignant neoplasm of breast: Secondary | ICD-10-CM | POA: Diagnosis not present

## 2022-01-09 DIAGNOSIS — I700 Atherosclerosis of aorta: Secondary | ICD-10-CM | POA: Diagnosis present

## 2022-01-09 DIAGNOSIS — J90 Pleural effusion, not elsewhere classified: Secondary | ICD-10-CM | POA: Diagnosis present

## 2022-01-09 DIAGNOSIS — K5000 Crohn's disease of small intestine without complications: Secondary | ICD-10-CM | POA: Insufficient documentation

## 2022-01-09 DIAGNOSIS — Z860100 Personal history of colon polyps, unspecified: Secondary | ICD-10-CM

## 2022-01-09 DIAGNOSIS — K37 Unspecified appendicitis: Secondary | ICD-10-CM | POA: Diagnosis not present

## 2022-01-09 DIAGNOSIS — Z833 Family history of diabetes mellitus: Secondary | ICD-10-CM | POA: Diagnosis not present

## 2022-01-09 DIAGNOSIS — M8580 Other specified disorders of bone density and structure, unspecified site: Secondary | ICD-10-CM | POA: Diagnosis present

## 2022-01-09 DIAGNOSIS — K651 Peritoneal abscess: Secondary | ICD-10-CM | POA: Diagnosis not present

## 2022-01-09 DIAGNOSIS — R1030 Lower abdominal pain, unspecified: Secondary | ICD-10-CM

## 2022-01-09 DIAGNOSIS — K50019 Crohn's disease of small intestine with unspecified complications: Secondary | ICD-10-CM | POA: Diagnosis not present

## 2022-01-09 DIAGNOSIS — R188 Other ascites: Secondary | ICD-10-CM

## 2022-01-09 DIAGNOSIS — R100 Acute abdomen: Secondary | ICD-10-CM

## 2022-01-09 DIAGNOSIS — K3533 Acute appendicitis with perforation and localized peritonitis, with abscess: Secondary | ICD-10-CM | POA: Diagnosis present

## 2022-01-09 DIAGNOSIS — B954 Other streptococcus as the cause of diseases classified elsewhere: Secondary | ICD-10-CM | POA: Diagnosis present

## 2022-01-09 DIAGNOSIS — N739 Female pelvic inflammatory disease, unspecified: Secondary | ICD-10-CM | POA: Diagnosis present

## 2022-01-09 DIAGNOSIS — M1990 Unspecified osteoarthritis, unspecified site: Secondary | ICD-10-CM | POA: Diagnosis present

## 2022-01-09 LAB — CBC WITH DIFFERENTIAL/PLATELET
Abs Immature Granulocytes: 0.18 10*3/uL — ABNORMAL HIGH (ref 0.00–0.07)
Basophils Absolute: 0.1 10*3/uL (ref 0.0–0.1)
Basophils Relative: 0 %
Eosinophils Absolute: 0 10*3/uL (ref 0.0–0.5)
Eosinophils Relative: 0 %
HCT: 38.4 % (ref 36.0–46.0)
Hemoglobin: 12.9 g/dL (ref 12.0–15.0)
Immature Granulocytes: 1 %
Lymphocytes Relative: 7 %
Lymphs Abs: 1.4 10*3/uL (ref 0.7–4.0)
MCH: 31.3 pg (ref 26.0–34.0)
MCHC: 33.6 g/dL (ref 30.0–36.0)
MCV: 93.2 fL (ref 80.0–100.0)
Monocytes Absolute: 1.4 10*3/uL — ABNORMAL HIGH (ref 0.1–1.0)
Monocytes Relative: 7 %
Neutro Abs: 17.4 10*3/uL — ABNORMAL HIGH (ref 1.7–7.7)
Neutrophils Relative %: 85 %
Platelets: 315 10*3/uL (ref 150–400)
RBC: 4.12 MIL/uL (ref 3.87–5.11)
RDW: 12.3 % (ref 11.5–15.5)
WBC: 20.4 10*3/uL — ABNORMAL HIGH (ref 4.0–10.5)
nRBC: 0 % (ref 0.0–0.2)

## 2022-01-09 LAB — MAGNESIUM: Magnesium: 2.2 mg/dL (ref 1.7–2.4)

## 2022-01-09 LAB — COMPREHENSIVE METABOLIC PANEL
ALT: 17 U/L (ref 0–44)
AST: 26 U/L (ref 15–41)
Albumin: 4.1 g/dL (ref 3.5–5.0)
Alkaline Phosphatase: 63 U/L (ref 38–126)
Anion gap: 13 (ref 5–15)
BUN: 16 mg/dL (ref 8–23)
CO2: 24 mmol/L (ref 22–32)
Calcium: 9 mg/dL (ref 8.9–10.3)
Chloride: 97 mmol/L — ABNORMAL LOW (ref 98–111)
Creatinine, Ser: 1.36 mg/dL — ABNORMAL HIGH (ref 0.44–1.00)
GFR, Estimated: 41 mL/min — ABNORMAL LOW (ref >60)
Glucose, Bld: 106 mg/dL — ABNORMAL HIGH (ref 70–99)
Potassium: 3.8 mmol/L (ref 3.5–5.1)
Sodium: 134 mmol/L — ABNORMAL LOW (ref 135–145)
Total Bilirubin: 1.3 mg/dL — ABNORMAL HIGH (ref 0.3–1.2)
Total Protein: 7.5 g/dL (ref 6.5–8.1)

## 2022-01-09 LAB — URINALYSIS, ROUTINE W REFLEX MICROSCOPIC
Bilirubin Urine: NEGATIVE
Glucose, UA: NEGATIVE mg/dL
Ketones, ur: 15 mg/dL — AB
Leukocytes,Ua: NEGATIVE
Nitrite: NEGATIVE
Protein, ur: 30 mg/dL — AB
Specific Gravity, Urine: <=1.005 (ref 1.005–1.030)
pH: 6.5 (ref 5.0–8.0)

## 2022-01-09 LAB — POCT URINALYSIS DIP (MANUAL ENTRY)
Glucose, UA: NEGATIVE mg/dL
Leukocytes, UA: NEGATIVE
Nitrite, UA: NEGATIVE
Protein Ur, POC: >=300 mg/dL — AB
Spec Grav, UA: 1.02 (ref 1.010–1.025)
Urobilinogen, UA: 0.2 E.U./dL
pH, UA: 7 (ref 5.0–8.0)

## 2022-01-09 LAB — POC HEMOCCULT BLD/STL (OFFICE/1-CARD/DIAGNOSTIC)
Card #1 Date: NEGATIVE
Fecal Occult Blood, POC: NEGATIVE

## 2022-01-09 LAB — URINALYSIS, MICROSCOPIC (REFLEX)

## 2022-01-09 LAB — LACTIC ACID, PLASMA
Lactic Acid, Venous: 1.9 mmol/L (ref 0.5–1.9)
Lactic Acid, Venous: 2.8 mmol/L (ref 0.5–1.9)
Lactic Acid, Venous: 2.2 mmol/L (ref 0.5–1.9)

## 2022-01-09 LAB — PHOSPHORUS: Phosphorus: 3 mg/dL (ref 2.5–4.6)

## 2022-01-09 LAB — LIPASE, BLOOD: Lipase: 37 U/L (ref 11–51)

## 2022-01-09 MED ORDER — ACETAMINOPHEN 500 MG PO TABS
1000.0000 mg | ORAL_TABLET | Freq: Once | ORAL | Status: AC
  Administered 2022-01-09: 1000 mg via ORAL
  Filled 2022-01-09: qty 2

## 2022-01-09 MED ORDER — SODIUM CHLORIDE 0.9 % IV BOLUS
500.0000 mL | Freq: Once | INTRAVENOUS | Status: AC
  Administered 2022-01-09: 500 mL via INTRAVENOUS

## 2022-01-09 MED ORDER — METRONIDAZOLE 500 MG/100ML IV SOLN
500.0000 mg | Freq: Once | INTRAVENOUS | Status: AC
  Administered 2022-01-09: 500 mg via INTRAVENOUS
  Filled 2022-01-09: qty 100

## 2022-01-09 MED ORDER — HYDROMORPHONE HCL 1 MG/ML IJ SOLN
1.0000 mg | Freq: Once | INTRAMUSCULAR | Status: AC
  Administered 2022-01-09: 1 mg via INTRAVENOUS
  Filled 2022-01-09: qty 1

## 2022-01-09 MED ORDER — LACTATED RINGERS IV BOLUS (SEPSIS)
1000.0000 mL | Freq: Once | INTRAVENOUS | Status: AC
  Administered 2022-01-09: 1000 mL via INTRAVENOUS

## 2022-01-09 MED ORDER — SODIUM CHLORIDE 0.9 % IV SOLN
2.0000 g | Freq: Two times a day (BID) | INTRAVENOUS | Status: DC
  Administered 2022-01-09 – 2022-01-12 (×6): 2 g via INTRAVENOUS
  Filled 2022-01-09 (×7): qty 12.5

## 2022-01-09 MED ORDER — ACETAMINOPHEN 325 MG PO TABS
650.0000 mg | ORAL_TABLET | Freq: Once | ORAL | Status: AC
  Administered 2022-01-09: 650 mg via ORAL

## 2022-01-09 MED ORDER — ONDANSETRON HCL 4 MG/2ML IJ SOLN
4.0000 mg | Freq: Once | INTRAMUSCULAR | Status: AC
  Administered 2022-01-09: 4 mg via INTRAVENOUS
  Filled 2022-01-09: qty 2

## 2022-01-09 MED ORDER — METRONIDAZOLE 500 MG/100ML IV SOLN: 500.0000 mg | Freq: Three times a day (TID) | INTRAVENOUS | Status: DC

## 2022-01-09 MED ORDER — METRONIDAZOLE 500 MG/100ML IV SOLN
500.0000 mg | Freq: Two times a day (BID) | INTRAVENOUS | Status: DC
  Administered 2022-01-09 – 2022-01-12 (×6): 500 mg via INTRAVENOUS
  Filled 2022-01-09 (×6): qty 100

## 2022-01-09 MED ORDER — HYDROMORPHONE HCL 1 MG/ML IJ SOLN
2.0000 mg | INTRAMUSCULAR | Status: DC | PRN
  Administered 2022-01-09 – 2022-01-10 (×2): 2 mg via INTRAVENOUS
  Administered 2022-01-11: 1 mg via INTRAVENOUS
  Filled 2022-01-09 (×4): qty 2

## 2022-01-09 MED ORDER — FENTANYL CITRATE PF 50 MCG/ML IJ SOSY
100.0000 ug | PREFILLED_SYRINGE | Freq: Once | INTRAMUSCULAR | Status: AC
  Administered 2022-01-09: 100 ug via INTRAVENOUS

## 2022-01-09 MED ORDER — IOHEXOL 300 MG/ML  SOLN
100.0000 mL | Freq: Once | INTRAMUSCULAR | Status: AC | PRN
  Administered 2022-01-09: 100 mL via INTRAVENOUS

## 2022-01-09 MED ORDER — SODIUM CHLORIDE 0.9 % IV SOLN: INTRAVENOUS | Status: DC

## 2022-01-09 MED ORDER — SODIUM CHLORIDE 0.9 % IV SOLN
2.0000 g | Freq: Once | INTRAVENOUS | Status: AC
  Administered 2022-01-09: 2 g via INTRAVENOUS
  Filled 2022-01-09: qty 12.5

## 2022-01-09 MED ORDER — LACTATED RINGERS IV SOLN
INTRAVENOUS | Status: AC
Start: 2022-01-09 — End: 2022-01-10

## 2022-01-09 MED ORDER — LACTATED RINGERS IV BOLUS
1000.0000 mL | Freq: Once | INTRAVENOUS | Status: AC
  Administered 2022-01-09: 1000 mL via INTRAVENOUS

## 2022-01-09 MED ORDER — ENOXAPARIN SODIUM 40 MG/0.4ML IJ SOSY
40.0000 mg | PREFILLED_SYRINGE | INTRAMUSCULAR | Status: DC
  Administered 2022-01-09 – 2022-01-12 (×4): 40 mg via SUBCUTANEOUS
  Filled 2022-01-09 (×4): qty 0.4

## 2022-01-09 MED ORDER — FENTANYL CITRATE PF 50 MCG/ML IJ SOSY
PREFILLED_SYRINGE | INTRAMUSCULAR | Status: AC
  Filled 2022-01-09: qty 2

## 2022-01-09 NOTE — Sepsis Progress Note (Signed)
Attempted to reach out to MD regarding need for 3rd LA. No response. But in secure chat with bedside RN at [facility], pt is en route to [facility] for admission, so may be a delay. Per [facility] RN and MAR, pt received 1L bolus plus 528m bolus after increased LA.

## 2022-01-09 NOTE — ED Provider Notes (Signed)
[facility]    CSN: [id number] Arrival date & time: 01/09/22  0806      History   Chief Complaint Chief Complaint  Patient presents with   Abdominal Pain    HPI Alexis Holmes is a 71 y.o. female.   HPI Healthy [age] year old woman.  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  Patient states that she has a history of constipation in the past.  No other bowel problems such as IBS, diverticula.  No known ulcers or GERD.  She is here for abdominal pain.  She states that she has not had a normal bowel movement for several days.  She states she has taken MiraLAX and she is taken milk of magnesia.  Drinking lots of water.  Has not had a meal for 2 days because of decreased appetite and a feeling of distention.  She states she has had a fecal impaction in the past.  She fears this is happening now.  She has acute abdominal pain and is very uncomfortable She had a colonoscopy in 2019.  Showed tubular adenoma.  The report is not available in the chart although I did a chart review She did have a kidney stone in 2018.  Had to have a lithotripsy procedure.  This does not feel like kidney stone pain.  I will get a UA  Past Medical History:  Diagnosis Date   Arthritis    Colon polyps    Hypercholesterolemia    Intention tremor    Right   Menopause    Osteopenia    Vitamin D deficiency     Patient Active Problem List   Diagnosis Date Noted   Sepsis due to undetermined organism ([place]) 01/09/2022   BRONCHITIS, ACUTE 04/15/2011   Menopause 03/12/2011   Arthritis 03/12/2011   Other and unspecified hyperlipidemia 03/12/2011   Tremor 03/12/2011   Vitamin D deficiency 03/12/2011   Osteopenia 03/12/2011   Colon polyps 03/12/2011    Past Surgical History:  Procedure Laterality Date   EXTRACORPOREAL SHOCK WAVE LITHOTRIPSY Left 02/20/2020   Procedure: EXTRACORPOREAL SHOCK WAVE LITHOTRIPSY (ESWL);  Surgeon: Raynelle Bring, MD;  Location: [facility];  Service:  Urology;  Laterality: Left;   LEG SURGERY  1988-1990   s/p fracture    OB History   No obstetric history on file.      Home Medications    Prior to Admission medications   Medication Sig Start Date End Date Taking? Authorizing Provider  magnesium hydroxide (MILK OF MAGNESIA) 400 MG/5ML suspension Take by mouth daily as needed for mild constipation.   Yes [provider]  psyllium (METAMUCIL) 58.6 % powder Take 1 packet by mouth 3 (three) times daily.   Yes [provider]  alendronate (FOSAMAX) 70 MG tablet Take 70 mg by mouth once a week. 02/26/21   [provider]  cyclobenzaprine (FLEXERIL) 5 MG tablet Take 1 tablet (5 mg total) by mouth at bedtime. Patient not taking: Reported on 01/09/2022 12/02/21   Raylene Everts, MD  DULoxetine (CYMBALTA) 20 MG capsule Take 20 mg by mouth daily.    [provider]  naproxen sodium (ANAPROX DS) 550 MG tablet Take 1 tablet (550 mg total) by mouth 2 (two) times daily with a meal. 12/02/21   Raylene Everts, MD    Family History Family History  Problem Relation Age of Onset   Fibromyalgia Brother    Diabetes Maternal Grandfather    Breast cancer Maternal Aunt  Social History Social History   Tobacco Use   Smoking status: Never   Smokeless tobacco: Never  Substance Use Topics   Alcohol use: No   Drug use: No     Allergies   Patient has no known allergies.   Review of Systems Review of Systems See HPI  Physical Exam Triage Vital Signs ED Triage Vitals  Enc Vitals Group     BP 01/09/22 0817 109/68     Pulse Rate 01/09/22 0817 88     Resp 01/09/22 0817 14     Temp 01/09/22 0817 98.6 F (37 C)     Temp Source 01/09/22 0817 Oral     SpO2 01/09/22 0817 97 %     Weight 01/09/22 0819 138 lb (62.6 kg)     Height --      Head Circumference --      Peak Flow --      Pain Score 01/09/22 0819 10     Pain Loc --      Pain Edu? --      Excl. in [place]? --    No data found.  Updated  Vital Signs BP 109/68 (BP Location: Right Arm)   Pulse 88   Temp 98.6 F (37 C) (Oral)   Resp 14   Wt 62.6 kg   SpO2 97%   BMI 26.07 kg/m       Physical Exam Constitutional:      General: She is in acute distress.     Appearance: She is well-developed and normal weight. She is ill-appearing.  HENT:     Head: Normocephalic and atraumatic.     Mouth/Throat:     Mouth: Mucous membranes are moist.  Eyes:     Conjunctiva/sclera: Conjunctivae normal.     Pupils: Pupils are equal, round, and reactive to light.  Cardiovascular:     Rate and Rhythm: Normal rate.     Heart sounds: Normal heart sounds.  Pulmonary:     Effort: Pulmonary effort is normal. No respiratory distress.     Breath sounds: Normal breath sounds.  Abdominal:     General: Bowel sounds are normal. There is distension.     Tenderness: There is abdominal tenderness in the right lower quadrant and left lower quadrant.     Comments: Patient is acutely uncomfortable.  Upper abdomen is soft.  Bowel sounds are active.  Lower abdomen somewhat distended.  Very tender all across lower abdomen, even light pressure causes grimace and vocalization.  Musculoskeletal:        General: Normal range of motion.     Cervical back: Normal range of motion.  Skin:    General: Skin is warm and dry.  Neurological:     General: No focal deficit present.     Mental Status: She is alert.     UC Treatments / Results  Labs (all labs ordered are listed, but only abnormal results are displayed) Labs Reviewed  POCT URINALYSIS DIP (MANUAL ENTRY) - Abnormal; Notable for the following components:      Result Value   Color, UA straw (*)    Bilirubin, UA small (*)    Ketones, POC UA moderate (40) (*)    Blood, UA trace-intact (*)    Protein Ur, POC >=300 (*)    All other components within normal limits  POC HEMOCCULT BLD/STL (OFFICE/1-CARD/DIAGNOSTIC)     Procedures (including critical care time)  Medications Ordered in  UC Medications  acetaminophen (TYLENOL) tablet 650 mg (650  mg Oral Given 01/09/22 0824)    Initial Impression / Assessment and Plan / UC Course  I have reviewed the triage vital signs and the nursing notes.  Pertinent labs & imaging results that were available during my care of the patient were reviewed by me and considered in my medical decision making (see chart for details).     The flatplate abdomen is read by radiology.  Let the patient know that she does not have a fecal impaction as she suspected.  There is a low burden of stool in the colon.  I ordered a CT abdomen and discussed this with our radiology department here at [facility] urgent care, unfortunately, CT cannot be done on the Medicare patient because of the lack of radiologist onsite.  We therefore transferred the patient to the emergency room to be seen.  I explained to her there she can get emergent lab work, pain management, and additional imaging appropriate for the level of pain in her abdomen. Final Clinical Impressions(s) / UC Diagnoses   Final diagnoses:  Acute abdomen     Discharge Instructions      We are unable to do your CT scan because of insurance reasons GO TO ER     ED Prescriptions   None    PDMP not reviewed this encounter.   Raylene Everts, MD 01/09/22 1242

## 2022-01-09 NOTE — Progress Notes (Signed)
Plan of Care Note for accepted transfer   Patient: Alexis Holmes MRN: [id number]   [place]: 01/09/2022  Facility requesting transfer: [facility]. Requesting Provider: Debbe Mounts, PA-C and Campbell Stall, DO Reason for transfer: Abdominal pain/sepsis due to undetermined organism. Facility course:  71 year old female with a past medical history of hyperlipidemia, osteopenia, nephrolithiasis, vitamin D deficiency, unspecified tremor who presented to the emergency department with complaints of abdominal pain since Tuesday associated with 5-6 episodes of loose stool daily.  She met sepsis criteria.  Work-up showed leukocytosis of 20.4, mildly elevated creatinine, mildly elevated bilirubin and imaging with severely inflamed terminal ileum.  She was given fluids, cefepime, metronidazole and symptomatic treatment.  The case was discussed with general surgery who agree with antibiotics.  Surgery did not believe she have an acute abdomen and will see the patient in consult once she arrives to [facility].  Plan of care: The patient is accepted for admission to Telemetry unit, at [facility].  Author: Reubin Milan, MD 01/09/2022  Check [email or web address] for on-call coverage.  Nursing staff, Please call [place] number on Amion as soon as patient's arrival, so appropriate admitting provider can evaluate the pt.

## 2022-01-09 NOTE — Sepsis Progress Note (Signed)
eLink monitoring code sepsis.  

## 2022-01-09 NOTE — H&P (Signed)
History and Physical  Alexis Holmes GYJ:[id number] DOB: [birth year] DOA: 01/09/2022  PCP: Holland Commons, FNP Patient coming from: Home  I have personally briefly reviewed patient's old medical records in [place]   Chief Complaint: abd pain and diarhea  HPI: Alexis Holmes is a 71 y.o. female past medical history of hyperlipidemia and previous nephrolithiasis comes in complaining of abdominal pain that started on Tuesday located in her lower quadrant it has progressively gotten worse to the point where she cannot move.  She initially relates she was constipated and started taking MiraLAX and milk of magnesia and started suddenly having multiple bowel movements.  She has had a decreased appetite with no eating over the last 24 to 48 hours.  The ED: She was found to be febrile with a leukocytosis of 20,000, lactic acid of 2.8, CT scan of the abdomen pelvis showed appendix is severely inflamed along with the cecum and small bowel likely secondary to adjacent acute appendicitis   Review of Systems: All systems reviewed and apart from history of presenting illness, are negative.  Past Medical History:  Diagnosis Date   Arthritis    Colon polyps    Hypercholesterolemia    Intention tremor    Right   Menopause    Osteopenia    Vitamin D deficiency    Past Surgical History:  Procedure Laterality Date   EXTRACORPOREAL SHOCK WAVE LITHOTRIPSY Left 02/20/2020   Procedure: EXTRACORPOREAL SHOCK WAVE LITHOTRIPSY (ESWL);  Surgeon: Raynelle Bring, MD;  Location: [facility];  Service: Urology;  Laterality: Left;   LEG SURGERY  1988-1990   s/p fracture   Social History:  reports that she has never smoked. She has never used smokeless tobacco. She reports that she does not drink alcohol and does not use drugs.   No Known Allergies  Family History  Problem Relation Age of Onset   Fibromyalgia Brother    Diabetes Maternal Grandfather    Breast cancer Maternal  Aunt     Prior to Admission medications   Medication Sig Start Date End Date Taking? Authorizing Provider  alendronate (FOSAMAX) 70 MG tablet Take 70 mg by mouth once a week. 02/26/21   [provider]  cyclobenzaprine (FLEXERIL) 5 MG tablet Take 1 tablet (5 mg total) by mouth at bedtime. Patient not taking: Reported on 01/09/2022 12/02/21   Raylene Everts, MD  DULoxetine (CYMBALTA) 20 MG capsule Take 20 mg by mouth daily.    [provider]  magnesium hydroxide (MILK OF MAGNESIA) 400 MG/5ML suspension Take by mouth daily as needed for mild constipation.    [provider]  naproxen sodium (ANAPROX DS) 550 MG tablet Take 1 tablet (550 mg total) by mouth 2 (two) times daily with a meal. 12/02/21   Raylene Everts, MD  psyllium (METAMUCIL) 58.6 % powder Take 1 packet by mouth 3 (three) times daily.    [provider]   Physical Exam: Vitals:   01/09/22 1515 01/09/22 1540 01/09/22 1620 01/09/22 1723  BP: (!) 98/55 (!) 96/56 92/60 (!) 99/56  Pulse: 98 97 99 (!) 101  Resp:    20  Temp:   (!) 100.8 F (38.2 C) 100.2 F (37.9 C)  TempSrc:   Oral Oral  SpO2: 93% 96% 95% 98%  Weight:    61.2 kg  Height:    '5\' 1"'$  (1.549 m)    General exam: Moderately built and nourished patient, lying comfortably supine on the gurney in no  obvious distress. Head, eyes and ENT: Nontraumatic and normocephalic.  Neck: Supple. No JVD, carotid bruit or thyromegaly. Lymphatics: No lymphadenopathy. Respiratory system: Clear to auscultation. No increased work of breathing. Cardiovascular system: S1 and S2 heard, RRR. No JVD. Gastrointestinal system: No bowel sounds, abdomen and nondistended with guarding and rebound or guarding in the right lower quadrant.  The patient cannot bend her knees as she is in severe pain Central nervous system: Alert and oriented. No focal neurological deficits. Extremities: Symmetric 5 x 5 power. Peripheral pulses symmetrically felt.  Skin: No  rashes or acute findings. Musculoskeletal system: Negative exam. Psychiatry: Pleasant and cooperative.   Labs on Admission:  Basic Metabolic Panel: Recent Labs  Lab 01/09/22 1035  NA 134*  K 3.8  CL 97*  CO2 24  GLUCOSE 106*  BUN 16  CREATININE 1.36*  CALCIUM 9.0  MG 2.2  PHOS 3.0   Liver Function Tests: Recent Labs  Lab 01/09/22 1035  AST 26  ALT 17  ALKPHOS 63  BILITOT 1.3*  PROT 7.5  ALBUMIN 4.1   Recent Labs  Lab 01/09/22 1035  LIPASE 37   No results for input(s): AMMONIA in the last 168 hours. CBC: Recent Labs  Lab 01/09/22 1035  WBC 20.4*  NEUTROABS 17.4*  HGB 12.9  HCT 38.4  MCV 93.2  PLT 315   Cardiac Enzymes: No results for input(s): CKTOTAL, CKMB, CKMBINDEX, TROPONINI in the last 168 hours.  BNP (last 3 results) No results for input(s): PROBNP in the last 8760 hours. CBG: No results for input(s): GLUCAP in the last 168 hours.  Radiological Exams on Admission: DG Abdomen 1 View  Result Date: 01/09/2022 CLINICAL DATA:  Lower abdominal pain, possible bowel impaction EXAM: ABDOMEN - 1 VIEW COMPARISON:  Prior abdominal radiograph 03/12/2020 FINDINGS: No large free air. No focal bowel dilation to suggest obstruction. The colonic stool burden is low. No evidence of fecal impaction. Gas noted within the sigmoid colon. Lung bases are clear. No abnormal calcifications. IMPRESSION: 1. No evidence of obstruction. 2. Low/normal colonic stool burden.  No evidence of fecal impaction. Electronically Signed   By: Jacqulynn Cadet M.D.   On: 01/09/2022 08:52   CT ABDOMEN PELVIS W CONTRAST  Result Date: 01/09/2022 CLINICAL DATA:  Abdominal pain, acute, nonlocalized EXAM: CT ABDOMEN AND PELVIS WITH CONTRAST TECHNIQUE: Multidetector CT imaging of the abdomen and pelvis was performed using the standard protocol following bolus administration of intravenous contrast. RADIATION DOSE REDUCTION: This exam was performed according to the departmental dose-optimization  program which includes automated exposure control, adjustment of the mA and/or kV according to patient size and/or use of iterative reconstruction technique. CONTRAST:  128m OMNIPAQUE IOHEXOL 300 MG/ML  SOLN COMPARISON:  CT 02/13/2020, 11/27/2016 FINDINGS: Lower chest: Included lung bases are clear.  Heart size is normal. Hepatobiliary: No focal liver abnormality is seen. No gallstones, gallbladder wall thickening, or biliary dilatation. Pancreas: Unremarkable. No pancreatic ductal dilatation or surrounding inflammatory changes. Spleen: Normal in size without focal abnormality. Adrenals/Urinary Tract: Unremarkable adrenal glands. Small right renal cysts, stable from 2018 and benign. Kidneys enhance symmetrically. No renal stone or hydronephrosis. Urinary bladder is incompletely distended. Stomach/Bowel: Markedly abnormal appearance of the terminal ileum with severe wall thickening, mucosal hyperenhancement, and submucosal edema (series 5, images 48-51). The appendix is seen adjacent to the terminal ileum and also appears inflamed (series 5, images 34-46). Adjacent cecum and small bowel loops also appear mildly inflamed. No dilated loops of bowel to suggest obstruction. Stomach within normal limits.  Vascular/Lymphatic: Scattered aortoiliac atherosclerotic calcifications without aneurysm. No abdominopelvic lymphadenopathy. Reproductive: Inflammatory changes within the right adnexal region. Right ovary is difficult to discern. Uterus and left adnexa are unremarkable. Other: No pneumoperitoneum.  No abdominal wall hernia. Musculoskeletal: No acute or significant osseous findings. IMPRESSION: 1. Severely inflamed terminal ileum. The appendix is seen adjacent to the terminal ileum and is also severely inflamed. Adjacent cecum and small bowel loops also appear mildly inflamed. Findings are favored to represent a nonspecific infectious or inflammatory terminal ileitis including Crohn's disease. A reactive terminal ileitis  secondary to adjacent acute appendicitis is also a consideration. No evidence of perforation or well-defined/drainable fluid collection. Surgical and/or GI consultation is recommended. 2. Inflammatory changes within the right adnexal region. Right ovary is difficult to discern. The possibility of an entero-ovarian fistulized not excluded. Aortic Atherosclerosis (ICD10-I70.0). Electronically Signed   By: Davina Poke D.O.   On: 01/09/2022 11:40    EKG: Independently reviewed. none  Assessment/Plan Sepsis due to undetermined organism  secondary to probable Acute appendicitis: She was fluid resuscitated in the ED her tach acid is 2.8. We will continue aggressive IV fluid hydration, agree with antibiotics cefepime and Flagyl. General surgery has been consulted to see the patient today. Continue Tylenol for fever, also continue IV Dilaudid for pain. We will place n.p.o. Blood cultures have been sent. Included in differential but unlikely to be Crohn's disease due to her age this will be unlikely, unlikely a pelvic gynecological disease, unlikely a cecal diverticular.  Osteopenia: Hold Fosamax.     DVT Prophylaxis: lovenox Code Status: Full  Family Communication: friends  Disposition Plan: inpatient     It is my clinical opinion that admission to INPATIENT is reasonable and necessary in this 71 y.o. female sepsis physiology with probable acute appendicitis need inpatient treatment  Given the aforementioned, the predictability of an adverse outcome is felt to be significant. I expect that the patient will require at least 2 midnights in the hospital to treat this condition.  Charlynne Cousins MD [facility]   01/09/2022, 5:45 PM

## 2022-01-09 NOTE — Plan of Care (Signed)
  Problem: Education: Goal: Knowledge of General Education information will improve Description: Including pain rating scale, medication(s)/side effects and non-pharmacologic comfort measures Outcome: Progressing   Problem: Health Behavior/Discharge Planning: Goal: Ability to manage health-related needs will improve Outcome: Progressing   Problem: Safety: Goal: Ability to remain free from injury will improve Outcome: Progressing   Problem: Clinical Measurements: Goal: Diagnostic test results will improve Outcome: Not Progressing   Problem: Nutrition: Goal: Adequate nutrition will be maintained Outcome: Not Progressing   Problem: Pain Managment: Goal: General experience of comfort will improve Outcome: Not Progressing

## 2022-01-09 NOTE — Consult Note (Signed)
[facility] Surgery Consult Note  Alexis Holmes 01/30/1951  [id number].    Requesting MD: Campbell Stall Chief Complaint/Reason for Consult: terminal ileitis   HPI:  Alexis Holmes is a 71yo female PMH HLD, osteopenia, and nephrolithiasis who was transferred from [facility] to [facility] for admission with abdominal pain. States that her pain started 2 days ago. It is located in her lower abdomen and has progressively gotten worse. Initially thought she was constipated so she took some miralax and milk of magnesia. Since then she has been having diarrhea. Denies fever, chills, blood in stool, nausea, vomiting. States that she has had a similar pain before and it was due to impaction. Patient was worked up by ED with CT scan which shows severely inflamed terminal ileum, the appendix is adjacent to the terminal ileum and is also severely inflamed, adjacent cecum and small bowel loops appear mildly inflamed; findings are favored to represent a nonspecific infectious or inflammatory terminal ileitis including Crohn's disease; a reactive terminal ileitis secondary to adjacent acute appendicitis is also a consideration; no evidence of perforation or well-defined/drainable fluid collection.  Lab work pertinent for WBC 20.4, lactic acid 1.9>2.8>2.2, Cr 1.36.  General surgery asked to see.  Abdominal surgical history: none  No personal h/o IBD Anticoagulants: none Nonsmoker Denies alcohol or illicit drug use     All systems reviewed and otherwise negative except for as above  Family History  Problem Relation Age of Onset   Fibromyalgia Brother    Diabetes Maternal Grandfather    Breast cancer Maternal Aunt     Past Medical History:  Diagnosis Date   Arthritis    Colon polyps    Hypercholesterolemia    Intention tremor    Right   Menopause    Osteopenia    Vitamin D deficiency     Past Surgical History:  Procedure Laterality Date   EXTRACORPOREAL SHOCK WAVE LITHOTRIPSY Left 02/20/2020    Procedure: EXTRACORPOREAL SHOCK WAVE LITHOTRIPSY (ESWL);  Surgeon: Raynelle Bring, MD;  Location: [facility];  Service: Urology;  Laterality: Left;   LEG SURGERY  1988-1990   s/p fracture    Social History:  reports that she has never smoked. She has never used smokeless tobacco. She reports that she does not drink alcohol and does not use drugs.  Allergies: No Known Allergies  (Not in a hospital admission)   Prior to Admission medications   Medication Sig Start Date End Date Taking? Authorizing Provider  alendronate (FOSAMAX) 70 MG tablet Take 70 mg by mouth once a week. 02/26/21   [provider]  cyclobenzaprine (FLEXERIL) 5 MG tablet Take 1 tablet (5 mg total) by mouth at bedtime. Patient not taking: Reported on 01/09/2022 12/02/21   Raylene Everts, MD  DULoxetine (CYMBALTA) 20 MG capsule Take 20 mg by mouth daily.    [provider]  magnesium hydroxide (MILK OF MAGNESIA) 400 MG/5ML suspension Take by mouth daily as needed for mild constipation.    [provider]  naproxen sodium (ANAPROX DS) 550 MG tablet Take 1 tablet (550 mg total) by mouth 2 (two) times daily with a meal. 12/02/21   Raylene Everts, MD  psyllium (METAMUCIL) 58.6 % powder Take 1 packet by mouth 3 (three) times daily.    [provider]    Blood pressure (!) 93/55, pulse 97, temperature 99 F (37.2 C), temperature source Oral, resp. rate (!) 25, height '5\' 1"'$  (1.549 m), weight 61.2 kg, SpO2 94 %. Physical Exam: General:  pleasant, female who is lying in bed in NAD HEENT: head is normocephalic, atraumatic.  Sclera are noninjected.  Pupils equal and round.  Ears and nose without any masses or lesions.  Mouth is pink and moist. Dentition fair Heart: regular, rate, and rhythm.   Lungs: CTAB, no wheezes, rhonchi, or rales noted.  Respiratory effort nonlabored Abd: soft, mildly distended, TTP RLQ, mild TTP LLQ and SP MS: no BUE/BLE edema, calves soft and  nontender Skin: warm and dry with no masses, lesions, or rashes Psych: A&Ox4 with an appropriate affect Neuro: MAEs, non-focal  Results for orders placed or performed during the hospital encounter of 01/09/22 (from the past 48 hour(s))  Comprehensive metabolic panel     Status: Abnormal   Collection Time: 01/09/22 10:35 AM  Result Value Ref Range   Sodium 134 (L) 135 - 145 mmol/L   Potassium 3.8 3.5 - 5.1 mmol/L   Chloride 97 (L) 98 - 111 mmol/L   CO2 24 22 - 32 mmol/L   Glucose, Bld 106 (H) 70 - 99 mg/dL    Comment: Glucose reference range applies only to samples taken after fasting for at least 8 hours.   BUN 16 8 - 23 mg/dL   Creatinine, Ser 1.36 (H) 0.44 - 1.00 mg/dL   Calcium 9.0 8.9 - 10.3 mg/dL   Total Protein 7.5 6.5 - 8.1 g/dL   Albumin 4.1 3.5 - 5.0 g/dL   AST 26 15 - 41 U/L   ALT 17 0 - 44 U/L   Alkaline Phosphatase 63 38 - 126 U/L   Total Bilirubin 1.3 (H) 0.3 - 1.2 mg/dL   GFR, Estimated 41 (L) >60 mL/min    Comment: (NOTE) Calculated using the CKD-EPI Creatinine Equation (2021)    Anion gap 13 5 - 15    Comment: Performed at [facility], [address] [place], [place] [address]  CBC with Differential     Status: Abnormal   Collection Time: 01/09/22 10:35 AM  Result Value Ref Range   WBC 20.4 (H) 4.0 - 10.5 K/uL   RBC 4.12 3.87 - 5.11 MIL/uL   Hemoglobin 12.9 12.0 - 15.0 g/dL   HCT 38.4 36.0 - 46.0 %   MCV 93.2 80.0 - 100.0 fL   MCH 31.3 26.0 - 34.0 pg   MCHC 33.6 30.0 - 36.0 g/dL   RDW 12.3 11.5 - 15.5 %   Platelets 315 150 - 400 K/uL   nRBC 0.0 0.0 - 0.2 %   Neutrophils Relative % 85 %   Neutro Abs 17.4 (H) 1.7 - 7.7 K/uL   Lymphocytes Relative 7 %   Lymphs Abs 1.4 0.7 - 4.0 K/uL   Monocytes Relative 7 %   Monocytes Absolute 1.4 (H) 0.1 - 1.0 K/uL   Eosinophils Relative 0 %   Eosinophils Absolute 0.0 0.0 - 0.5 K/uL   Basophils Relative 0 %   Basophils Absolute 0.1 0.0 - 0.1 K/uL   Immature Granulocytes 1 %   Abs Immature Granulocytes  0.18 (H) 0.00 - 0.07 K/uL    Comment: Performed at [facility], [address] [place], [place] [address]  Lipase, blood     Status: None   Collection Time: 01/09/22 10:35 AM  Result Value Ref Range   Lipase 37 11 - 51 U/L    Comment: Performed at [facility], [address] [place], [place] [address]  Magnesium     Status: None  Collection Time: 01/09/22 10:35 AM  Result Value Ref Range   Magnesium 2.2 1.7 - 2.4 mg/dL    Comment: Performed at [facility], [address] [place], [place] [address]  Phosphorus     Status: None   Collection Time: 01/09/22 10:35 AM  Result Value Ref Range   Phosphorus 3.0 2.5 - 4.6 mg/dL    Comment: Performed at [facility], [address] [place], [place] [address]  Urinalysis, Routine w reflex microscopic Urine, Clean Catch     Status: Abnormal   Collection Time: 01/09/22 11:09 AM  Result Value Ref Range   Color, Urine YELLOW YELLOW   APPearance CLEAR CLEAR   Specific Gravity, Urine <=1.005 1.005 - 1.030   pH 6.5 5.0 - 8.0   Glucose, UA NEGATIVE NEGATIVE mg/dL   Hgb urine dipstick TRACE (A) NEGATIVE   Bilirubin Urine NEGATIVE NEGATIVE   Ketones, ur 15 (A) NEGATIVE mg/dL   Protein, ur 30 (A) NEGATIVE mg/dL   Nitrite NEGATIVE NEGATIVE   Leukocytes,Ua NEGATIVE NEGATIVE    Comment: Performed at [facility], [address] [place], [place] [address]  Lactic acid, plasma     Status: None   Collection Time: 01/09/22 11:09 AM  Result Value Ref Range   Lactic Acid, Venous 1.9 0.5 - 1.9 mmol/L    Comment: Performed at [facility], [address] [place], [place] [address]  Urinalysis, Microscopic (reflex)     Status: Abnormal   Collection Time: 01/09/22 11:09 AM  Result Value Ref Range   RBC / HPF 0-5 0 - 5 RBC/hpf   WBC, UA 0-5 0 - 5 WBC/hpf   Bacteria, UA RARE (A) NONE SEEN   Squamous Epithelial / LPF 0-5 0 - 5    Comment: Performed at [facility], [address] [place], [place] [address]  Lactic acid, plasma     Status: Abnormal   Collection Time: 01/09/22 12:51 PM  Result Value Ref Range   Lactic Acid, Venous 2.8 (HH) 0.5 - 1.9 mmol/L    Comment: CRITICAL RESULT CALLED TO, READ BACK BY AND VERIFIED WITH: LOUIE CAPA RN '@[id number]'$  01/09/2022 OLSONM Performed at [facility], [address] [place], [place] [address]    DG Abdomen 1 View  Result Date: 01/09/2022 CLINICAL DATA:  Lower abdominal pain, possible bowel impaction EXAM: ABDOMEN - 1 VIEW COMPARISON:  Prior abdominal radiograph 03/12/2020 FINDINGS: No large free air. No focal bowel dilation to suggest obstruction. The colonic stool burden is low. No evidence of fecal impaction. Gas noted within the sigmoid colon. Lung bases are clear. No abnormal calcifications. IMPRESSION: 1. No evidence of obstruction. 2. Low/normal colonic stool burden.  No evidence of fecal impaction. Electronically Signed   By: Jacqulynn Cadet M.D.   On: 01/09/2022 08:52   CT ABDOMEN PELVIS W CONTRAST  Result Date: 01/09/2022 CLINICAL DATA:  Abdominal pain, acute, nonlocalized EXAM: CT ABDOMEN AND PELVIS WITH CONTRAST TECHNIQUE: Multidetector CT imaging of the abdomen and pelvis was performed using the standard protocol following bolus administration of intravenous contrast. RADIATION DOSE REDUCTION: This exam was performed according to the departmental dose-optimization program which includes automated exposure control, adjustment of the mA and/or kV according to patient size and/or use of iterative reconstruction technique. CONTRAST:  170m OMNIPAQUE IOHEXOL 300 MG/ML  SOLN COMPARISON:  CT 02/13/2020, 11/27/2016 FINDINGS: Lower chest: Included lung bases are clear.  Heart size is  normal. Hepatobiliary: No focal liver abnormality is seen. No gallstones, gallbladder wall thickening, or biliary dilatation. Pancreas: Unremarkable. No pancreatic ductal dilatation or surrounding inflammatory changes. Spleen: Normal in  size without focal abnormality. Adrenals/Urinary Tract: Unremarkable adrenal glands. Small right renal cysts, stable from 2018 and benign. Kidneys enhance symmetrically. No renal stone or hydronephrosis. Urinary bladder is incompletely distended. Stomach/Bowel: Markedly abnormal appearance of the terminal ileum with severe wall thickening, mucosal hyperenhancement, and submucosal edema (series 5, images 48-51). The appendix is seen adjacent to the terminal ileum and also appears inflamed (series 5, images 34-46). Adjacent cecum and small bowel loops also appear mildly inflamed. No dilated loops of bowel to suggest obstruction. Stomach within normal limits. Vascular/Lymphatic: Scattered aortoiliac atherosclerotic calcifications without aneurysm. No abdominopelvic lymphadenopathy. Reproductive: Inflammatory changes within the right adnexal region. Right ovary is difficult to discern. Uterus and left adnexa are unremarkable. Other: No pneumoperitoneum.  No abdominal wall hernia. Musculoskeletal: No acute or significant osseous findings. IMPRESSION: 1. Severely inflamed terminal ileum. The appendix is seen adjacent to the terminal ileum and is also severely inflamed. Adjacent cecum and small bowel loops also appear mildly inflamed. Findings are favored to represent a nonspecific infectious or inflammatory terminal ileitis including Crohn's disease. A reactive terminal ileitis secondary to adjacent acute appendicitis is also a consideration. No evidence of perforation or well-defined/drainable fluid collection. Surgical and/or GI consultation is recommended. 2. Inflammatory changes within the right adnexal region. Right ovary is difficult to discern. The possibility of an entero-ovarian fistulized not excluded. Aortic Atherosclerosis (ICD10-I70.0). Electronically Signed   By: Davina Poke D.O.   On: 01/09/2022 11:40      Assessment/Plan Terminal ileitis  - CT scan shows severely inflamed terminal ileum, the  appendix is adjacent to the terminal ileum and is also severely inflamed, adjacent cecum and small bowel loops appear mildly inflamed; findings are favored to represent a nonspecific infectious or inflammatory terminal ileitis including Crohn's disease; no evidence of perforation or well-defined/drainable fluid collection. Suspect appendix is secondarily inflamed - On exam she is TTP in the RLQ and focally peritonitic on that side.  There is no guarding or rigidity.  She is hemodynamically stable. Upon evaluation of the CT, a surgery in this picture would probably involve removing most of her ileum as well as her cecum and possibly her R colon.  This would be an extensive operation with significant recovery time.  Given that she is hemodynamically stable, I have given her the option to try bowel rest and IV abx for the 24-48h first, to see if this resolves her ileitis.  She understands that if she were to become hemodynamically unstable or exhibit signs of end organ damage, that she would require emergent surgery.  She is also aware that she may form an abscess that would require further medical care.  She would like to avoid a surgery if at all possible.  Recommend aggressive IVF's, IV abx, NPO.  Repeat labs in AM  ID - cefepime/ flagyl VTE - SCDs, Lovenox FEN - IVF, NPO Foley - none  HLD Osteopenia Nephrolithiasis  I reviewed last 24 h vitals and pain scores, last 48 h intake and output, last 24 h labs and trends, and last 24 h imaging results   Rosario Adie, MD  [facility] 01/09/2022, 3:13 PM Please see Amion for pager number during day hours 7:00am-4:30pm

## 2022-01-09 NOTE — ED Provider Notes (Signed)
[facility] Provider Note   CSN: [id number] Arrival date & time: 01/09/22  1016     History  Chief Complaint  Patient presents with   Abdominal Pain    Alexis Holmes is a 71 y.o. female with a medical history of hypercholesteremia, osteopenia, previous nephrolithiasis.  Patient presents with a chief complaint of abdominal pain.  Pain started on Tuesday.  Pain is located to her lower abdomen.  Pain has gotten progressively worse over time.  Patient reports that she was having some constipation however started taking MiraLAX and milk ultimately magnesia.  Since then patient has been having diarrhea since Tuesday.  Patient was seen at urgent care earlier today and sent to the emergency department for further evaluation.  Patient reports that she had sudden worsening of pain within the last hour.  Denies any history of abdominal surgeries, recent international travel, camping, antibiotic use, fever, chills, blood in stool, melena, dysuria, hematuria, urinary urgency, vaginal pain, vaginal bleeding, vaginal discharge.   Abdominal Pain Associated symptoms: no chest pain, no chills, no constipation, no diarrhea, no dysuria, no fever, no hematuria, no nausea, no shortness of breath, no vaginal bleeding, no vaginal discharge and no vomiting       Home Medications Prior to Admission medications   Medication Sig Start Date End Date Taking? Authorizing Provider  alendronate (FOSAMAX) 70 MG tablet Take 70 mg by mouth once a week. 02/26/21   [provider]  cyclobenzaprine (FLEXERIL) 5 MG tablet Take 1 tablet (5 mg total) by mouth at bedtime. Patient not taking: Reported on 01/09/2022 12/02/21   Raylene Everts, MD  DULoxetine (CYMBALTA) 20 MG capsule Take 20 mg by mouth daily.    [provider]  magnesium hydroxide (MILK OF MAGNESIA) 400 MG/5ML suspension Take by mouth daily as needed for mild constipation.    [provider]  naproxen  sodium (ANAPROX DS) 550 MG tablet Take 1 tablet (550 mg total) by mouth 2 (two) times daily with a meal. 12/02/21   Raylene Everts, MD  psyllium (METAMUCIL) 58.6 % powder Take 1 packet by mouth 3 (three) times daily.    [provider]      Allergies    Patient has no known allergies.    Review of Systems   Review of Systems  Constitutional:  Negative for chills and fever.  Eyes:  Negative for visual disturbance.  Respiratory:  Negative for shortness of breath.   Cardiovascular:  Negative for chest pain.  Gastrointestinal:  Positive for abdominal distention and abdominal pain. Negative for anal bleeding, blood in stool, constipation, diarrhea, nausea, rectal pain and vomiting.  Genitourinary:  Negative for difficulty urinating, dysuria, frequency, hematuria, urgency, vaginal bleeding, vaginal discharge and vaginal pain.  Musculoskeletal:  Negative for back pain and neck pain.  Skin:  Negative for color change and rash.  Neurological:  Negative for dizziness, syncope, light-headedness and headaches.  Psychiatric/Behavioral:  Negative for confusion.    Physical Exam Updated Vital Signs BP (!) 113/97 (BP Location: Right Arm)   Pulse 79   Temp 99 F (37.2 C) (Oral)   Resp (!) 21   Ht '5\' 1"'$  (1.549 m)   Wt 61.2 kg   SpO2 100%   BMI 25.51 kg/m  Physical Exam Vitals and nursing note reviewed.  Constitutional:      General: She is not in acute distress.    Appearance: She is not ill-appearing, toxic-appearing or diaphoretic.     Comments: Uncomfortable due  to reports of abdominal pain  HENT:     Head: Normocephalic.  Eyes:     General: No scleral icterus.       Right eye: No discharge.        Left eye: No discharge.  Cardiovascular:     Rate and Rhythm: Normal rate.  Pulmonary:     Effort: Pulmonary effort is normal.  Abdominal:     General: Abdomen is flat. Bowel sounds are increased. There is no distension. There are no signs of injury.     Palpations: Abdomen  is rigid. There is no mass or pulsatile mass.     Tenderness: There is generalized abdominal tenderness. There is guarding. There is no right CVA tenderness, left CVA tenderness or rebound.     Comments: Diffuse tenderness throughout entire abdomen.  Pain is increased to bilateral lower quadrants.  Guarding noted.  Skin:    General: Skin is warm and dry.  Neurological:     General: No focal deficit present.     Mental Status: She is alert.  Psychiatric:        Behavior: Behavior is cooperative.    ED Results / Procedures / Treatments   Labs (all labs ordered are listed, but only abnormal results are displayed) Labs Reviewed  COMPREHENSIVE METABOLIC PANEL - Abnormal; Notable for the following components:      Result Value   Sodium 134 (*)    Chloride 97 (*)    Glucose, Bld 106 (*)    Creatinine, Ser 1.36 (*)    Total Bilirubin 1.3 (*)    GFR, Estimated 41 (*)    All other components within normal limits  CBC WITH DIFFERENTIAL/PLATELET - Abnormal; Notable for the following components:   WBC 20.4 (*)    Neutro Abs 17.4 (*)    Monocytes Absolute 1.4 (*)    Abs Immature Granulocytes 0.18 (*)    All other components within normal limits  URINE CULTURE  CULTURE, BLOOD (ROUTINE X 2)  CULTURE, BLOOD (ROUTINE X 2)  LIPASE, BLOOD  LACTIC ACID, PLASMA  URINALYSIS, ROUTINE W REFLEX MICROSCOPIC  LACTIC ACID, PLASMA    EKG None  Radiology DG Abdomen 1 View  Result Date: 01/09/2022 CLINICAL DATA:  Lower abdominal pain, possible bowel impaction EXAM: ABDOMEN - 1 VIEW COMPARISON:  Prior abdominal radiograph 03/12/2020 FINDINGS: No large free air. No focal bowel dilation to suggest obstruction. The colonic stool burden is low. No evidence of fecal impaction. Gas noted within the sigmoid colon. Lung bases are clear. No abnormal calcifications. IMPRESSION: 1. No evidence of obstruction. 2. Low/normal colonic stool burden.  No evidence of fecal impaction. Electronically Signed   By: Jacqulynn Cadet M.D.   On: 01/09/2022 08:52   CT ABDOMEN PELVIS W CONTRAST  Result Date: 01/09/2022 CLINICAL DATA:  Abdominal pain, acute, nonlocalized EXAM: CT ABDOMEN AND PELVIS WITH CONTRAST TECHNIQUE: Multidetector CT imaging of the abdomen and pelvis was performed using the standard protocol following bolus administration of intravenous contrast. RADIATION DOSE REDUCTION: This exam was performed according to the departmental dose-optimization program which includes automated exposure control, adjustment of the mA and/or kV according to patient size and/or use of iterative reconstruction technique. CONTRAST:  143m OMNIPAQUE IOHEXOL 300 MG/ML  SOLN COMPARISON:  CT 02/13/2020, 11/27/2016 FINDINGS: Lower chest: Included lung bases are clear.  Heart size is normal. Hepatobiliary: No focal liver abnormality is seen. No gallstones, gallbladder wall thickening, or biliary dilatation. Pancreas: Unremarkable. No pancreatic ductal dilatation or surrounding inflammatory changes.  Spleen: Normal in size without focal abnormality. Adrenals/Urinary Tract: Unremarkable adrenal glands. Small right renal cysts, stable from 2018 and benign. Kidneys enhance symmetrically. No renal stone or hydronephrosis. Urinary bladder is incompletely distended. Stomach/Bowel: Markedly abnormal appearance of the terminal ileum with severe wall thickening, mucosal hyperenhancement, and submucosal edema (series 5, images 48-51). The appendix is seen adjacent to the terminal ileum and also appears inflamed (series 5, images 34-46). Adjacent cecum and small bowel loops also appear mildly inflamed. No dilated loops of bowel to suggest obstruction. Stomach within normal limits. Vascular/Lymphatic: Scattered aortoiliac atherosclerotic calcifications without aneurysm. No abdominopelvic lymphadenopathy. Reproductive: Inflammatory changes within the right adnexal region. Right ovary is difficult to discern. Uterus and left adnexa are unremarkable. Other:  No pneumoperitoneum.  No abdominal wall hernia. Musculoskeletal: No acute or significant osseous findings. IMPRESSION: 1. Severely inflamed terminal ileum. The appendix is seen adjacent to the terminal ileum and is also severely inflamed. Adjacent cecum and small bowel loops also appear mildly inflamed. Findings are favored to represent a nonspecific infectious or inflammatory terminal ileitis including Crohn's disease. A reactive terminal ileitis secondary to adjacent acute appendicitis is also a consideration. No evidence of perforation or well-defined/drainable fluid collection. Surgical and/or GI consultation is recommended. 2. Inflammatory changes within the right adnexal region. Right ovary is difficult to discern. The possibility of an entero-ovarian fistulized not excluded. Aortic Atherosclerosis (ICD10-I70.0). Electronically Signed   By: Davina Poke D.O.   On: 01/09/2022 11:40    Procedures .Critical Care Performed by: Loni Beckwith, PA-C Authorized by: Loni Beckwith, PA-C   Critical care provider statement:    Critical care time (minutes):  30   Critical care was necessary to treat or prevent imminent or life-threatening deterioration of the following conditions:  Sepsis   Critical care was time spent personally by me on the following activities:  Development of treatment plan with patient or surrogate, discussions with consultants, evaluation of patient's response to treatment, examination of patient, ordering and review of laboratory studies, ordering and review of radiographic studies, ordering and performing treatments and interventions, pulse oximetry, re-evaluation of patient's condition, review of old charts and obtaining history from patient or surrogate   Care discussed with: admitting provider      Medications Ordered in ED Medications  lactated ringers infusion ( Intravenous New Bag/Given 01/09/22 1125)  lactated ringers bolus 1,000 mL (1,000 mLs Intravenous  New Bag/Given 01/09/22 1123)  metroNIDAZOLE (FLAGYL) IVPB 500 mg (500 mg Intravenous New Bag/Given 01/09/22 1124)  ceFEPIme (MAXIPIME) 2 g in sodium chloride 0.9 % 100 mL IVPB (has no administration in time range)  HYDROmorphone (DILAUDID) injection 1 mg (1 mg Intravenous Given 01/09/22 1047)  ondansetron (ZOFRAN) injection 4 mg (4 mg Intravenous Given 01/09/22 1047)  fentaNYL (SUBLIMAZE) injection 100 mcg ( Intravenous Canceled Entry 01/09/22 1118)  iohexol (OMNIPAQUE) 300 MG/ML solution 100 mL (100 mLs Intravenous Contrast Given 01/09/22 1111)  ceFEPIme (MAXIPIME) 2 g in sodium chloride 0.9 % 100 mL IVPB (2 g Intravenous New Bag/Given 01/09/22 1126)    ED Course/ Medical Decision Making/ A&P Clinical Course as of 01/09/22 1241  Thu Jan 09, 2022  1241 I spoke to the hospitalist Dr. Olevia Bowens who agreed to accept the patient for admission.   [PB]    Clinical Course User Index [PB] Loni Beckwith, PA-C                           Medical Decision Making Amount  and/or Complexity of Data Reviewed Labs: ordered. Radiology: ordered.  Risk Prescription drug management. Decision regarding hospitalization.   Alert 71 year old female who is uncomfortable secondary to complaints of abdominal pain.  Patient is nontoxic-appearing.  Information obtained from patient and patient's friend Judson Roch at bedside.  Past medical records were reviewed including previous provider notes, labs, and imaging.  Patient has medical history as outlined in HPI which complicates her care.  Per chart review patient was seen at [facility] health urgent care earlier today.  Patient had x-ray imaging of abdomen which showed no evidence of obstruction, no evidence of fecal impaction, low/normal colonic stool burden.  POCT urinalysis dipstick showed no signs of infection.  Hemoccult was negative.  Patient given Dilaudid for pain management.  Will obtain CMP, CBC, lipase, and urinalysis.  Will obtain CT imaging due to patient's reports  of abdominal pain.  With lower abdominal pain concern for possible diverticulitis, acute appendicitis, bowel perforation.  Patient was discussed with and evaluated by Dr. Pearline Cables.  Due to concern for perforation will obtain CT abdomen pelvis with contrast prior to obtaining creatinine.  Patient was made aware and is agreeable with this plan.  Dr. Pearline Cables is agreeable with this plan.  I personally viewed interpret patient's lab results.  Pertinent findings include an: -Leukocytosis 20.4 -Creatinine 1.36 -Lactic acid 1.9  I personally viewed interpreted patient CT imaging.  Agree with radiology interpretation of severely inflamed terminal ileum, appendix is adjacent and also severely inflamed.  Adjacent cecum and small bowel loops are mildly inflamed.  No evidence of perforation or well-defined/drainable fluid collection.  Due to findings we will consult general surgery due to concern for possible appendicitis.  I spoke to APP with general surgery team who advised to have patient moved to have patient admitted to [facility] long.  They will see patient for consult upon arrival.  Advised that gastroenterology will likely need to be on consult as well.           Final Clinical Impression(s) / ED Diagnoses Final diagnoses:  Terminal ileitis without complication ([place])  Sepsis with acute renal failure without septic shock, due to unspecified organism, unspecified acute renal failure type [facility])    Rx / DC Orders ED Discharge Orders     None         Loni Beckwith, PA-C 28/78/67 [id number]    Campbell Stall P, DO 94/70/96 1324

## 2022-01-09 NOTE — ED Triage Notes (Signed)
Patient arrives POV c/o lower abdominal pain that started Tuesday. Pt has hx of impacted bowel and kidney stones; pt was evaluated at [facility] in [place] this morning. Pt states she has diarrhea that started on Tuesday. Pt pain 10/10.

## 2022-01-09 NOTE — ED Notes (Signed)
Patient is being discharged from the Urgent Care and sent to the Emergency Department via POV . Per Francesca Oman MD, patient is in need of higher level of care due to ABD pain. Patient is aware and verbalizes understanding of plan of care.  Vitals:   01/09/22 0817  BP: 109/68  Pulse: 88  Resp: 14  Temp: 98.6 F (37 C)  SpO2: 97%

## 2022-01-09 NOTE — ED Notes (Signed)
CLIENT INSTRUCTED TO REMAIN NPO

## 2022-01-09 NOTE — Discharge Instructions (Signed)
We are unable to do your CT scan because of insurance reasons GO TO ER

## 2022-01-09 NOTE — Sepsis Progress Note (Signed)
Notified provider of need to order repeat lactic acid (3rd). .  

## 2022-01-09 NOTE — Progress Notes (Signed)
       CROSS COVER NOTE  NAME: Alexis Holmes MRN: [id number] DOB : March 06, 1951   Secure chat received from nursing reporting increased oxygenation requirement. Patient received 3L of IVF in ED and is on continuous fluids. CXR obtained and show bibasilar atelectasis.   Alexis Holmes has also had soft BP since ~1500 01/09/22 and pressures have progressively decreased overnight. 1.5L IVF given and PO midodrine. Patient has remained hypotensive despite these interventions. Transfer orders placed to move patient to stepdown for possible initiation of Levophed. BP at 0700 currently 105/45 MAP 65.   Neomia Glass DNP, MHA, FNP-BC Nurse Practitioner [facility] Pager [phone]

## 2022-01-09 NOTE — Sepsis Progress Note (Signed)
6hr sepsis protocol monitoring window has completed for eLink. Was complete by the time pt arrived to [facility]. I secure chat with admitting MD highlighting need for 3rd LA.

## 2022-01-09 NOTE — ED Notes (Signed)
BC x obtained prior to Abx administration (site #1 Left AC) and (site #2 Right AC)

## 2022-01-09 NOTE — ED Triage Notes (Signed)
Pt presents with lower abdominal pain that began on Tuesday. Pt states she thinks she has an impacted bowel. Last "normal" BM on Monday. Pt endorses having diarrhea 5-6x per day. Pt states she has a history of an impacted bowel. Pt taking Milk of Mag and advil.

## 2022-01-09 NOTE — Progress Notes (Signed)
Pharmacy Antibiotic Note  Alexis Holmes is a 71 y.o. female admitted on 01/09/2022 with  intra-abdominal infection .  Pharmacy has been consulted for cefepime dosing. SCr 1.36 on presentation.  Plan: Cefepime 2g IV q12h Metronidazole '500mg'$  IV ordered x 1 per EDP - f/u if to continue Monitor clinical progress, c/s, renal function F/u de-escalation plan/LOT   Height: '5\' 1"'$  (154.9 cm) Weight: 61.2 kg (135 lb) IBW/kg (Calculated) : 47.8  Temp (24hrs), Avg:98.8 F (37.1 C), Min:98.6 F (37 C), Max:99 F (37.2 C)  Recent Labs  Lab 01/09/22 1035  WBC 20.4*    CrCl cannot be calculated (No successful lab value found.).    No Known Allergies  Arturo Morton, PharmD, BCPS Please check AMION for all [facility] contact numbers Clinical Pharmacist 01/09/2022 11:12 AM

## 2022-01-10 DIAGNOSIS — A419 Sepsis, unspecified organism: Secondary | ICD-10-CM | POA: Diagnosis not present

## 2022-01-10 DIAGNOSIS — K50019 Crohn's disease of small intestine with unspecified complications: Secondary | ICD-10-CM

## 2022-01-10 DIAGNOSIS — K5000 Crohn's disease of small intestine without complications: Secondary | ICD-10-CM | POA: Diagnosis not present

## 2022-01-10 LAB — MRSA NEXT GEN BY PCR, NASAL: MRSA by PCR Next Gen: NOT DETECTED

## 2022-01-10 LAB — BASIC METABOLIC PANEL
Anion gap: 7 (ref 5–15)
BUN: 18 mg/dL (ref 8–23)
CO2: 21 mmol/L — ABNORMAL LOW (ref 22–32)
Calcium: 7.5 mg/dL — ABNORMAL LOW (ref 8.9–10.3)
Chloride: 107 mmol/L (ref 98–111)
Creatinine, Ser: 1.08 mg/dL — ABNORMAL HIGH (ref 0.44–1.00)
GFR, Estimated: 55 mL/min — ABNORMAL LOW (ref >60)
Glucose, Bld: 84 mg/dL (ref 70–99)
Potassium: 5.1 mmol/L (ref 3.5–5.1)
Sodium: 135 mmol/L (ref 135–145)

## 2022-01-10 LAB — CBC
HCT: 29.6 % — ABNORMAL LOW (ref 36.0–46.0)
Hemoglobin: 10.1 g/dL — ABNORMAL LOW (ref 12.0–15.0)
MCH: 32 pg (ref 26.0–34.0)
MCHC: 34.1 g/dL (ref 30.0–36.0)
MCV: 93.7 fL (ref 80.0–100.0)
Platelets: 210 10*3/uL (ref 150–400)
RBC: 3.16 MIL/uL — ABNORMAL LOW (ref 3.87–5.11)
RDW: 12.6 % (ref 11.5–15.5)
WBC: 9.6 10*3/uL (ref 4.0–10.5)
nRBC: 0 % (ref 0.0–0.2)

## 2022-01-10 LAB — URINE CULTURE: Culture: NO GROWTH

## 2022-01-10 MED ORDER — ORAL CARE MOUTH RINSE
15.0000 mL | Freq: Two times a day (BID) | OROMUCOSAL | Status: DC
  Administered 2022-01-10 – 2022-01-17 (×12): 15 mL via OROMUCOSAL

## 2022-01-10 MED ORDER — ACETAMINOPHEN 325 MG PO TABS
650.0000 mg | ORAL_TABLET | Freq: Four times a day (QID) | ORAL | Status: DC | PRN
  Administered 2022-01-10 – 2022-01-12 (×2): 650 mg via ORAL
  Filled 2022-01-10 (×2): qty 2

## 2022-01-10 MED ORDER — LACTATED RINGERS IV BOLUS
500.0000 mL | Freq: Once | INTRAVENOUS | Status: AC
  Administered 2022-01-10: 500 mL via INTRAVENOUS

## 2022-01-10 MED ORDER — CHLORHEXIDINE GLUCONATE CLOTH 2 % EX PADS
6.0000 | MEDICATED_PAD | Freq: Every day | CUTANEOUS | Status: DC
  Administered 2022-01-10 – 2022-01-17 (×8): 6 via TOPICAL

## 2022-01-10 MED ORDER — MIDODRINE HCL 5 MG PO TABS
5.0000 mg | ORAL_TABLET | Freq: Once | ORAL | Status: AC
  Administered 2022-01-10: 5 mg via ORAL
  Filled 2022-01-10: qty 1

## 2022-01-10 MED ORDER — SODIUM CHLORIDE 0.9 % IV SOLN
8.0000 mg | Freq: Four times a day (QID) | INTRAVENOUS | Status: DC | PRN
  Administered 2022-01-11: 8 mg via INTRAVENOUS
  Filled 2022-01-10 (×2): qty 4

## 2022-01-10 MED ORDER — MIDODRINE HCL 5 MG PO TABS
10.0000 mg | ORAL_TABLET | Freq: Once | ORAL | Status: AC
  Administered 2022-01-10: 10 mg via ORAL
  Filled 2022-01-10: qty 2

## 2022-01-10 MED ORDER — SODIUM CHLORIDE 0.9 % IV BOLUS
1000.0000 mL | Freq: Once | INTRAVENOUS | Status: AC
  Administered 2022-01-10: 1000 mL via INTRAVENOUS

## 2022-01-10 MED ORDER — SODIUM CHLORIDE 0.9 % IV SOLN: INTRAVENOUS | Status: DC

## 2022-01-10 NOTE — Progress Notes (Addendum)
[facility] Progress Note     Subjective: CC:  States she is starting to feel worse - increased lower abdominal pain, worse with movement. Denies flatus/BM since admission. Denies nausea/vomiting. Her friend is at bedside. This is also her support person, they live together. Pt denies a history of tobacco use. Denies history of heart or lung issues, other than one episode of pleurisy.   Objective: Vital signs in last 24 hours: Temp:  [98.2 F (36.8 C)-100.8 F (38.2 C)] 98.3 F (36.8 C) (05/19 0626) Pulse Rate:  [77-121] 97 (05/19 0700) Resp:  [13-25] 19 (05/19 0700) BP: (76-124)/(45-97) 105/45 (05/19 0700) SpO2:  [88 %-100 %] 95 % (05/19 0700) Weight:  [61.2 kg-62.6 kg] 61.2 kg (05/18 1723)    Intake/Output from previous day: 05/18 0701 - 05/19 0700 In: 4689.1 [I.V.:2843.9; IV Piggyback:1845.2] Out: 5 [Urine:530] Intake/Output this shift: Total I/O In: 1123.8 [I.V.:1123.8] Out: -   PE: Gen:  Alert female, appears younger than stated age, NAD but appears uncomfortable. Card:  Regular rate and rhythm, pedal pulses 2+ BL Pulm: taking short, quick breaths due to pain, on 5L [place]. clear to auscultation bilaterally diminished BL bases. Abd: Soft, mild to moderate distention, exquisitely tender lower abdomen, localized peritonitis w/ +Rovsings sign Skin: warm and dry, no rashes  Psych: A&Ox3   Lab Results:  Recent Labs    01/09/22 1035 01/10/22 0429  WBC 20.4* 9.6  HGB 12.9 10.1*  HCT 38.4 29.6*  PLT 315 210   BMET Recent Labs    01/09/22 1035 01/10/22 0429  NA 134* 135  K 3.8 5.1  CL 97* 107  CO2 24 21*  GLUCOSE 106* 84  BUN 16 18  CREATININE 1.36* 1.08*  CALCIUM 9.0 7.5*   PT/INR No results for input(s): LABPROT, INR in the last 72 hours. CMP     Component Value Date/Time   NA 135 01/10/2022 0429   K 5.1 01/10/2022 0429   CL 107 01/10/2022 0429   CO2 21 (L) 01/10/2022 0429   GLUCOSE 84 01/10/2022 0429   BUN 18 01/10/2022 0429    CREATININE 1.08 (H) 01/10/2022 0429   CALCIUM 7.5 (L) 01/10/2022 0429   PROT 7.5 01/09/2022 1035   ALBUMIN 4.1 01/09/2022 1035   AST 26 01/09/2022 1035   ALT 17 01/09/2022 1035   ALKPHOS 63 01/09/2022 1035   BILITOT 1.3 (H) 01/09/2022 1035   GFRNONAA 55 (L) 01/10/2022 0429   Lipase     Component Value Date/Time   LIPASE 37 01/09/2022 1035       Studies/Results: DG Abdomen 1 View  Result Date: 01/09/2022 CLINICAL DATA:  Lower abdominal pain, possible bowel impaction EXAM: ABDOMEN - 1 VIEW COMPARISON:  Prior abdominal radiograph 03/12/2020 FINDINGS: No large free air. No focal bowel dilation to suggest obstruction. The colonic stool burden is low. No evidence of fecal impaction. Gas noted within the sigmoid colon. Lung bases are clear. No abnormal calcifications. IMPRESSION: 1. No evidence of obstruction. 2. Low/normal colonic stool burden.  No evidence of fecal impaction. Electronically Signed   By: Jacqulynn Cadet M.D.   On: 01/09/2022 08:52   CT ABDOMEN PELVIS W CONTRAST  Result Date: 01/09/2022 CLINICAL DATA:  Abdominal pain, acute, nonlocalized EXAM: CT ABDOMEN AND PELVIS WITH CONTRAST TECHNIQUE: Multidetector CT imaging of the abdomen and pelvis was performed using the standard protocol following bolus administration of intravenous contrast. RADIATION DOSE REDUCTION: This exam was performed according to the departmental dose-optimization program which includes automated exposure control, adjustment  of the mA and/or kV according to patient size and/or use of iterative reconstruction technique. CONTRAST:  130m OMNIPAQUE IOHEXOL 300 MG/ML  SOLN COMPARISON:  CT 02/13/2020, 11/27/2016 FINDINGS: Lower chest: Included lung bases are clear.  Heart size is normal. Hepatobiliary: No focal liver abnormality is seen. No gallstones, gallbladder wall thickening, or biliary dilatation. Pancreas: Unremarkable. No pancreatic ductal dilatation or surrounding inflammatory changes. Spleen: Normal in  size without focal abnormality. Adrenals/Urinary Tract: Unremarkable adrenal glands. Small right renal cysts, stable from 2018 and benign. Kidneys enhance symmetrically. No renal stone or hydronephrosis. Urinary bladder is incompletely distended. Stomach/Bowel: Markedly abnormal appearance of the terminal ileum with severe wall thickening, mucosal hyperenhancement, and submucosal edema (series 5, images 48-51). The appendix is seen adjacent to the terminal ileum and also appears inflamed (series 5, images 34-46). Adjacent cecum and small bowel loops also appear mildly inflamed. No dilated loops of bowel to suggest obstruction. Stomach within normal limits. Vascular/Lymphatic: Scattered aortoiliac atherosclerotic calcifications without aneurysm. No abdominopelvic lymphadenopathy. Reproductive: Inflammatory changes within the right adnexal region. Right ovary is difficult to discern. Uterus and left adnexa are unremarkable. Other: No pneumoperitoneum.  No abdominal wall hernia. Musculoskeletal: No acute or significant osseous findings. IMPRESSION: 1. Severely inflamed terminal ileum. The appendix is seen adjacent to the terminal ileum and is also severely inflamed. Adjacent cecum and small bowel loops also appear mildly inflamed. Findings are favored to represent a nonspecific infectious or inflammatory terminal ileitis including Crohn's disease. A reactive terminal ileitis secondary to adjacent acute appendicitis is also a consideration. No evidence of perforation or well-defined/drainable fluid collection. Surgical and/or GI consultation is recommended. 2. Inflammatory changes within the right adnexal region. Right ovary is difficult to discern. The possibility of an entero-ovarian fistulized not excluded. Aortic Atherosclerosis (ICD10-I70.0). Electronically Signed   By: NDavina PokeD.O.   On: 01/09/2022 11:40   DG CHEST PORT 1 VIEW  Result Date: 01/09/2022 CLINICAL DATA:  Hypoxia EXAM: PORTABLE CHEST 1  VIEW COMPARISON:  CT from 02/01/2018 FINDINGS: Cardiac shadow is within normal limits. The lungs are well aerated bilaterally. Patchy atelectatic changes are noted bilaterally. No sizable effusion is noted. No bony abnormality is seen. IMPRESSION: Bibasilar atelectasis. Electronically Signed   By: MInez CatalinaM.D.   On: 01/09/2022 22:43    Anti-infectives: Anti-infectives (From admission, onward)    Start     Dose/Rate Route Frequency Ordered Stop   01/09/22 2330  ceFEPIme (MAXIPIME) 2 g in sodium chloride 0.9 % 100 mL IVPB        2 g 200 mL/hr over 30 Minutes Intravenous Every 12 hours 01/09/22 1133     01/09/22 2200  metroNIDAZOLE (FLAGYL) IVPB 500 mg        500 mg 100 mL/hr over 60 Minutes Intravenous Every 12 hours 01/09/22 1402     01/09/22 1915  metroNIDAZOLE (FLAGYL) IVPB 500 mg  Status:  Discontinued        500 mg 100 mL/hr over 60 Minutes Intravenous Every 8 hours 01/09/22 1826 01/09/22 1828   01/09/22 1115  ceFEPIme (MAXIPIME) 2 g in sodium chloride 0.9 % 100 mL IVPB        2 g 200 mL/hr over 30 Minutes Intravenous  Once 01/09/22 1109 01/09/22 1156   01/09/22 1115  metroNIDAZOLE (FLAGYL) IVPB 500 mg        500 mg 100 mL/hr over 60 Minutes Intravenous  Once 01/09/22 1109 01/09/22 1224        Assessment/Plan Terminal ileitis, possible appendicitis   -  CT scan shows severely inflamed terminal ileum, the appendix is adjacent to the terminal ileum and is also severely inflamed, adjacent cecum and small bowel loops appear mildly inflamed; findings are favored to represent a nonspecific infectious or inflammatory terminal ileitis including Crohn's disease; no evidence of perforation or well-defined/drainable fluid collection.  - this morning the patients vitals and labs are improved -- fever resolved, tachycardia resolved, BP still soft. WBC 9 from 20.  - clinically the patient states she feels worse, shallow breathing and localized peritonitis. Per Dr. Marcello Moores' note the patient did  have focal peritonitis last night as well. I do have concerns that this patient will end up needing Dx laparoscopy, probable ex lap for appendicitis vs terminal ileitis. Will follow very closely. Dr. Georgette Dover to evaluate the patient this morning.   Please keep NPO.  IV abx   I again discussed the risks of surgery with the patient including bleeding, infection, open surgery, need for SBR, ileocecectomy, R hemicolectomy, drain placement, wound infection, prolonged hospital stay, as well as the risks of general anesthesia - heart attck, stroke, respiratory complications, and death.   LOS: 1 day   I reviewed nursing notes, hospitalist notes, last 24 h vitals and pain scores, last 48 h intake and output, last 24 h labs and trends, and last 24 h imaging results.  Obie Dredge, PA-C [facility] Please see Amion for pager number during day hours 7:00am-4:30pm

## 2022-01-10 NOTE — TOC Initial Note (Signed)
Transition of Care [facility]) - Initial/Assessment Note    Patient Details  Name: Alexis Holmes MRN: [id number] Date of Birth: 1951/01/06  Transition of Care [facility]) CM/SW Contact:    Leeroy Cha, RN Phone Number: 01/10/2022, 8:29 AM  Clinical Narrative:                  Transition of Care [facility]) Screening Note   Patient Details  Name: Alexis Holmes Date of Birth: 01/12/1951   Transition of Care [facility]) CM/SW Contact:    Leeroy Cha, RN Phone Number: 01/10/2022, 8:30 AM    Transition of Care Department [facility]) has reviewed patient and no TOC needs have been identified at this time. We will continue to monitor patient advancement through interdisciplinary progression rounds. If new patient transition needs arise, please place a TOC consult.    Expected Discharge Plan: Home/Self Care Barriers to Discharge: No Barriers Identified   Patient Goals and CMS Choice Patient states their goals for this hospitalization and ongoing recovery are:: to go home CMS Medicare.gov Compare Post Acute Care list provided to:: Legal Guardian Choice offered to / list presented to : [facility] POA / Guardian  Expected Discharge Plan and Services Expected Discharge Plan: Home/Self Care   Discharge Planning Services: CM Consult                                          Prior Living Arrangements/Services     Patient language and need for interpreter reviewed:: Yes Do you feel safe going back to the place where you live?: Yes            Criminal Activity/Legal Involvement Pertinent to Current Situation/Hospitalization: No - Comment as needed  Activities of Daily Living Home Assistive Devices/Equipment: None ADL Screening (condition at time of admission) Patient's cognitive ability adequate to safely complete daily activities?: Yes Is the patient deaf or have difficulty hearing?: No Does the patient have difficulty seeing, even when wearing glasses/contacts?: No Does the patient  have difficulty concentrating, remembering, or making decisions?: No Patient able to express need for assistance with ADLs?: Yes Does the patient have difficulty dressing or bathing?: Yes Independently performs ADLs?: No Communication: Independent Dressing (OT): Needs assistance Is this a change from baseline?: Change from baseline, expected to last <3days Grooming: Needs assistance Is this a change from baseline?: Change from baseline, expected to last <3 days Feeding: Independent Bathing: Needs assistance Is this a change from baseline?: Change from baseline, expected to last <3 days Toileting: Needs assistance Is this a change from baseline?: Change from baseline, expected to last <3 days In/Out Bed: Needs assistance Is this a change from baseline?: Change from baseline, expected to last <3 days Walks in Home: Independent Does the patient have difficulty walking or climbing stairs?: Yes Weakness of Legs: None Weakness of Arms/Hands: None  Permission Sought/Granted                  Emotional Assessment Appearance:: Appears stated age     Orientation: : Oriented to Place, Oriented to Self, Oriented to  Time, Oriented to Situation Alcohol / Substance Use: Not Applicable Psych Involvement: No (comment)  Admission diagnosis:  Terminal ileitis without complication ([place]) [Y10.17] Sepsis due to undetermined organism ([place]) [A41.9] Sepsis with acute renal failure without septic shock, due to unspecified organism, unspecified acute renal failure type ([place]) [A41.9, R65.20, N17.9] Acute appendicitis [K35.80] Patient  Active Problem List   Diagnosis Date Noted   Sepsis due to undetermined organism ([place]) 01/09/2022   Ileitis, regional ([place]) 01/09/2022   BRONCHITIS, ACUTE 04/15/2011   Menopause 03/12/2011   Arthritis 03/12/2011   Other and unspecified hyperlipidemia 03/12/2011   Tremor 03/12/2011   Vitamin D deficiency 03/12/2011   Osteopenia 03/12/2011   Colon polyps 03/12/2011    PCP:  Holland Commons, [place]:   [id number] [id number]: [phone]: [phone]    Social Determinants of Health (SDOH) Interventions    Readmission Risk Interventions     View : No data to display.

## 2022-01-10 NOTE — Progress Notes (Signed)
[facility] PROGRESS NOTE    Progress Note  Alexis Holmes  EPP:[id number] DOB: May 18, 1951 DOA: 01/09/2022 PCP: Holland Commons, FNP     Brief Narrative:   Alexis Holmes is an 71 y.o. female past medical history of hyperlipidemia and previous nephrolithiasis comes in complaining of abdominal pain that started on Tuesday located in her lower quadrant it has progressively gotten worse to the point where she cannot move.  White blood cell count was 20,000 CT scan showed appendix severely inflamed with the cecum and small bowel.  Hours.   Assessment/Plan:   Sepsis due to undetermined organism due to ileitis, regional [facility]) Continue IV cefepime and Flagyl, concern about an acute abdomen She has defervesced leukocytosis improved. Creatinine has improved sepsis physiology seems to be improving. She is about 5 L positive we will hold fluid.  If no improvement might need pressors. But she continues to be borderline hypotensive. Keep the patient n.p.o. Has required multiple doses of IV narcotics Blood cultures have been negative till date. Further management per surgical team   DVT prophylaxis: lovenox Family Communication:sister Status is: Inpatient Remains inpatient appropriate because: Sepsis due to regional.    Code Status:     Code Status Orders  (From admission, onward)           Start     Ordered   01/09/22 1826  Full code  Continuous        01/09/22 1826           Code Status History     This patient has a current code status but no historical code status.      Advance Directive Documentation    Flowsheet Row Most Recent Value  Type of Advance Directive Healthcare Power of Attorney  [[name]]  Pre-existing out of facility DNR order (yellow form or pink MOST form) --  "MOST" Form in Place? --         IV Access:   Peripheral IV   Procedures and diagnostic studies:   DG Abdomen 1 View  Result Date: 01/09/2022 CLINICAL DATA:   Lower abdominal pain, possible bowel impaction EXAM: ABDOMEN - 1 VIEW COMPARISON:  Prior abdominal radiograph 03/12/2020 FINDINGS: No large free air. No focal bowel dilation to suggest obstruction. The colonic stool burden is low. No evidence of fecal impaction. Gas noted within the sigmoid colon. Lung bases are clear. No abnormal calcifications. IMPRESSION: 1. No evidence of obstruction. 2. Low/normal colonic stool burden.  No evidence of fecal impaction. Electronically Signed   By: Jacqulynn Cadet M.D.   On: 01/09/2022 08:52   CT ABDOMEN PELVIS W CONTRAST  Result Date: 01/09/2022 CLINICAL DATA:  Abdominal pain, acute, nonlocalized EXAM: CT ABDOMEN AND PELVIS WITH CONTRAST TECHNIQUE: Multidetector CT imaging of the abdomen and pelvis was performed using the standard protocol following bolus administration of intravenous contrast. RADIATION DOSE REDUCTION: This exam was performed according to the departmental dose-optimization program which includes automated exposure control, adjustment of the mA and/or kV according to patient size and/or use of iterative reconstruction technique. CONTRAST:  129m OMNIPAQUE IOHEXOL 300 MG/ML  SOLN COMPARISON:  CT 02/13/2020, 11/27/2016 FINDINGS: Lower chest: Included lung bases are clear.  Heart size is normal. Hepatobiliary: No focal liver abnormality is seen. No gallstones, gallbladder wall thickening, or biliary dilatation. Pancreas: Unremarkable. No pancreatic ductal dilatation or surrounding inflammatory changes. Spleen: Normal in size without focal abnormality. Adrenals/Urinary Tract: Unremarkable adrenal glands. Small right renal cysts, stable from 2018 and benign. Kidneys enhance symmetrically. No  renal stone or hydronephrosis. Urinary bladder is incompletely distended. Stomach/Bowel: Markedly abnormal appearance of the terminal ileum with severe wall thickening, mucosal hyperenhancement, and submucosal edema (series 5, images 48-51). The appendix is seen adjacent to  the terminal ileum and also appears inflamed (series 5, images 34-46). Adjacent cecum and small bowel loops also appear mildly inflamed. No dilated loops of bowel to suggest obstruction. Stomach within normal limits. Vascular/Lymphatic: Scattered aortoiliac atherosclerotic calcifications without aneurysm. No abdominopelvic lymphadenopathy. Reproductive: Inflammatory changes within the right adnexal region. Right ovary is difficult to discern. Uterus and left adnexa are unremarkable. Other: No pneumoperitoneum.  No abdominal wall hernia. Musculoskeletal: No acute or significant osseous findings. IMPRESSION: 1. Severely inflamed terminal ileum. The appendix is seen adjacent to the terminal ileum and is also severely inflamed. Adjacent cecum and small bowel loops also appear mildly inflamed. Findings are favored to represent a nonspecific infectious or inflammatory terminal ileitis including Crohn's disease. A reactive terminal ileitis secondary to adjacent acute appendicitis is also a consideration. No evidence of perforation or well-defined/drainable fluid collection. Surgical and/or GI consultation is recommended. 2. Inflammatory changes within the right adnexal region. Right ovary is difficult to discern. The possibility of an entero-ovarian fistulized not excluded. Aortic Atherosclerosis (ICD10-I70.0). Electronically Signed   By: Davina Poke D.O.   On: 01/09/2022 11:40   DG CHEST PORT 1 VIEW  Result Date: 01/09/2022 CLINICAL DATA:  Hypoxia EXAM: PORTABLE CHEST 1 VIEW COMPARISON:  CT from 02/01/2018 FINDINGS: Cardiac shadow is within normal limits. The lungs are well aerated bilaterally. Patchy atelectatic changes are noted bilaterally. No sizable effusion is noted. No bony abnormality is seen. IMPRESSION: Bibasilar atelectasis. Electronically Signed   By: Inez Catalina M.D.   On: 01/09/2022 22:43     Medical Consultants:   None.   Subjective:    [name] anorexic, she relates her pain is  controlled.  Objective:    Vitals:   01/10/22 0320 01/10/22 0420 01/10/22 0515 01/10/22 0626  BP: (!) 76/56 (!) 86/52 (!) 93/52 (!) 86/49  Pulse: 82 80 81 78  Resp: (!) 24 (!) 22  18  Temp: 98.3 F (36.8 C) 98.2 F (36.8 C)  98.3 F (36.8 C)  TempSrc: Oral Oral  Oral  SpO2: 100% 99% 98% 96%  Weight:      Height:       SpO2: 96 % O2 Flow Rate (L/min): 5 L/min   Intake/Output Summary (Last 24 hours) at 01/10/2022 0659 Last data filed at 01/10/2022 0300 Gross per 24 hour  Intake 4689.12 ml  Output 530 ml  Net 4159.12 ml   Filed Weights   01/09/22 1025 01/09/22 1723  Weight: 61.2 kg 61.2 kg    Exam: General exam: In no acute distress. Respiratory system: Good air movement and clear to auscultation. Cardiovascular system: S1 & S2 heard, RRR. No JVD. Gastrointestinal system: Abdomen is nondistended, soft and nontender.  Extremities: No pedal edema. Skin: No rashes, lesions or ulcers Psychiatry: Judgement and insight appear normal. Mood & affect appropriate.    Data Reviewed:    Labs: Basic Metabolic Panel: Recent Labs  Lab 01/09/22 1035 01/10/22 0429  NA 134* 135  K 3.8 5.1  CL 97* 107  CO2 24 21*  GLUCOSE 106* 84  BUN 16 18  CREATININE 1.36* 1.08*  CALCIUM 9.0 7.5*  MG 2.2  --   PHOS 3.0  --    GFR Estimated Creatinine Clearance: 39.5 mL/min (A) (by C-G formula based on SCr of 1.08 mg/dL (H)).  Liver Function Tests: Recent Labs  Lab 01/09/22 1035  AST 26  ALT 17  ALKPHOS 63  BILITOT 1.3*  PROT 7.5  ALBUMIN 4.1   Recent Labs  Lab 01/09/22 1035  LIPASE 37   No results for input(s): AMMONIA in the last 168 hours. Coagulation profile No results for input(s): INR, PROTIME in the last 168 hours. COVID-19 Labs  No results for input(s): DDIMER, FERRITIN, LDH, CRP in the last 72 hours.  Lab Results  Component Value Date   [place] NEGATIVE 02/16/2020    CBC: Recent Labs  Lab 01/09/22 1035 01/10/22 0429  WBC 20.4* 9.6  NEUTROABS  17.4*  --   HGB 12.9 10.1*  HCT 38.4 29.6*  MCV 93.2 93.7  PLT 315 210   Cardiac Enzymes: No results for input(s): CKTOTAL, CKMB, CKMBINDEX, TROPONINI in the last 168 hours. BNP (last 3 results) No results for input(s): PROBNP in the last 8760 hours. CBG: No results for input(s): GLUCAP in the last 168 hours. D-Dimer: No results for input(s): DDIMER in the last 72 hours. Hgb A1c: No results for input(s): HGBA1C in the last 72 hours. Lipid Profile: No results for input(s): CHOL, HDL, LDLCALC, TRIG, CHOLHDL, LDLDIRECT in the last 72 hours. Thyroid function studies: No results for input(s): TSH, T4TOTAL, T3FREE, THYROIDAB in the last 72 hours.  Invalid input(s): FREET3 Anemia work up: No results for input(s): VITAMINB12, FOLATE, FERRITIN, TIBC, IRON, RETICCTPCT in the last 72 hours. Sepsis Labs: Recent Labs  Lab 01/09/22 1035 01/09/22 1109 01/09/22 1251 01/09/22 1747 01/10/22 0429  WBC 20.4*  --   --   --  9.6  LATICACIDVEN  --  1.9 2.8* 2.2*  --    Microbiology No results found for this or any previous visit (from the past 240 hour(s)).   Medications:    Chlorhexidine Gluconate Cloth  6 each Topical Daily   enoxaparin (LOVENOX) injection  40 mg Subcutaneous Q24H   mouth rinse  15 mL Mouth Rinse BID   Continuous Infusions:  sodium chloride 100 mL/hr at 01/09/22 2148   ceFEPime (MAXIPIME) IV 2 g (01/09/22 2354)   lactated ringers 150 mL/hr at 01/10/22 0413   metronidazole 500 mg (01/09/22 2158)      LOS: 1 day   Charlynne Cousins  [facility]  01/10/2022, 6:59 AM

## 2022-01-11 ENCOUNTER — Inpatient Hospital Stay (HOSPITAL_COMMUNITY): Payer: Medicare Other

## 2022-01-11 DIAGNOSIS — E7800 Pure hypercholesterolemia, unspecified: Secondary | ICD-10-CM | POA: Insufficient documentation

## 2022-01-11 DIAGNOSIS — K5900 Constipation, unspecified: Secondary | ICD-10-CM | POA: Insufficient documentation

## 2022-01-11 DIAGNOSIS — Z86010 Personal history of colonic polyps: Secondary | ICD-10-CM

## 2022-01-11 DIAGNOSIS — A419 Sepsis, unspecified organism: Secondary | ICD-10-CM | POA: Diagnosis not present

## 2022-01-11 DIAGNOSIS — R112 Nausea with vomiting, unspecified: Secondary | ICD-10-CM

## 2022-01-11 DIAGNOSIS — K50019 Crohn's disease of small intestine with unspecified complications: Secondary | ICD-10-CM | POA: Diagnosis not present

## 2022-01-11 DIAGNOSIS — M8580 Other specified disorders of bone density and structure, unspecified site: Secondary | ICD-10-CM | POA: Insufficient documentation

## 2022-01-11 DIAGNOSIS — K37 Unspecified appendicitis: Secondary | ICD-10-CM

## 2022-01-11 DIAGNOSIS — K5000 Crohn's disease of small intestine without complications: Secondary | ICD-10-CM | POA: Diagnosis not present

## 2022-01-11 LAB — BASIC METABOLIC PANEL
Anion gap: 10 (ref 5–15)
BUN: 19 mg/dL (ref 8–23)
CO2: 19 mmol/L — ABNORMAL LOW (ref 22–32)
Calcium: 7.2 mg/dL — ABNORMAL LOW (ref 8.9–10.3)
Chloride: 111 mmol/L (ref 98–111)
Creatinine, Ser: 1.11 mg/dL — ABNORMAL HIGH (ref 0.44–1.00)
GFR, Estimated: 53 mL/min — ABNORMAL LOW (ref >60)
Glucose, Bld: 72 mg/dL (ref 70–99)
Potassium: 3.9 mmol/L (ref 3.5–5.1)
Sodium: 140 mmol/L (ref 135–145)

## 2022-01-11 LAB — CBC
HCT: 33.7 % — ABNORMAL LOW (ref 36.0–46.0)
Hemoglobin: 10.7 g/dL — ABNORMAL LOW (ref 12.0–15.0)
MCH: 31.7 pg (ref 26.0–34.0)
MCHC: 31.8 g/dL (ref 30.0–36.0)
MCV: 99.7 fL (ref 80.0–100.0)
Platelets: 278 10*3/uL (ref 150–400)
RBC: 3.38 MIL/uL — ABNORMAL LOW (ref 3.87–5.11)
RDW: 12.9 % (ref 11.5–15.5)
WBC: 15 10*3/uL — ABNORMAL HIGH (ref 4.0–10.5)
nRBC: 0 % (ref 0.0–0.2)

## 2022-01-11 LAB — MAGNESIUM: Magnesium: 2 mg/dL (ref 1.7–2.4)

## 2022-01-11 LAB — BRAIN NATRIURETIC PEPTIDE: B Natriuretic Peptide: 263.2 pg/mL — ABNORMAL HIGH (ref 0.0–100.0)

## 2022-01-11 LAB — PREALBUMIN: Prealbumin: 6.4 mg/dL — ABNORMAL LOW (ref 18–38)

## 2022-01-11 LAB — PHOSPHORUS: Phosphorus: 2 mg/dL — ABNORMAL LOW (ref 2.5–4.6)

## 2022-01-11 MED ORDER — MAGIC MOUTHWASH: 15.0000 mL | Freq: Four times a day (QID) | ORAL | Status: DC | PRN

## 2022-01-11 MED ORDER — METOCLOPRAMIDE HCL 5 MG/ML IJ SOLN: 5.0000 mg | Freq: Three times a day (TID) | INTRAMUSCULAR | Status: DC | PRN

## 2022-01-11 MED ORDER — ACETAMINOPHEN 650 MG RE SUPP: 650.0000 mg | Freq: Four times a day (QID) | RECTAL | Status: DC | PRN

## 2022-01-11 MED ORDER — PHENOL 1.4 % MT LIQD
2.0000 | OROMUCOSAL | Status: DC | PRN
Start: 2022-01-11 — End: 2022-01-17

## 2022-01-11 MED ORDER — PROCHLORPERAZINE EDISYLATE 10 MG/2ML IJ SOLN: 5.0000 mg | INTRAMUSCULAR | Status: DC | PRN

## 2022-01-11 MED ORDER — DEXTROSE 5 % IV SOLN
1000.0000 mg | Freq: Four times a day (QID) | INTRAVENOUS | Status: DC | PRN
Start: 2022-01-11 — End: 2022-01-17

## 2022-01-11 MED ORDER — ALUM & MAG HYDROXIDE-SIMETH 200-200-20 MG/5ML PO SUSP: 30.0000 mL | Freq: Four times a day (QID) | ORAL | Status: DC | PRN

## 2022-01-11 MED ORDER — SIMETHICONE 40 MG/0.6ML PO SUSP
80.0000 mg | Freq: Four times a day (QID) | ORAL | Status: DC | PRN
Start: 2022-01-11 — End: 2022-01-17

## 2022-01-11 MED ORDER — MENTHOL 3 MG MT LOZG: 1.0000 | LOZENGE | OROMUCOSAL | Status: DC | PRN

## 2022-01-11 MED ORDER — LIP MEDEX EX OINT
1.0000 | TOPICAL_OINTMENT | Freq: Two times a day (BID) | CUTANEOUS | Status: DC
Start: 2022-01-11 — End: 2022-01-17
  Administered 2022-01-11 – 2022-01-17 (×13): 1 via TOPICAL
  Filled 2022-01-11 (×2): qty 7

## 2022-01-11 MED ORDER — DIPHENHYDRAMINE HCL 50 MG/ML IJ SOLN
12.5000 mg | Freq: Four times a day (QID) | INTRAMUSCULAR | Status: DC | PRN
  Filled 2022-01-11: qty 1

## 2022-01-11 MED ORDER — LACTATED RINGERS IV BOLUS
1000.0000 mL | Freq: Once | INTRAVENOUS | Status: AC
Start: 2022-01-11 — End: 2022-01-11
  Administered 2022-01-11: 1000 mL via INTRAVENOUS

## 2022-01-11 MED ORDER — MORPHINE SULFATE (PF) 2 MG/ML IV SOLN: 2.0000 mg | INTRAVENOUS | Status: DC | PRN

## 2022-01-11 MED ORDER — MORPHINE SULFATE (PF) 2 MG/ML IV SOLN
2.0000 mg | INTRAVENOUS | Status: DC | PRN
  Administered 2022-01-11 – 2022-01-16 (×11): 2 mg via INTRAVENOUS
  Filled 2022-01-11 (×11): qty 1

## 2022-01-11 MED ORDER — LACTATED RINGERS IV BOLUS: 1000.0000 mL | Freq: Three times a day (TID) | INTRAVENOUS | Status: AC | PRN

## 2022-01-11 MED ORDER — DEXTROSE-NACL 5-0.45 % IV SOLN
INTRAVENOUS | Status: DC
Start: 2022-01-11 — End: 2022-01-13

## 2022-01-11 NOTE — Progress Notes (Signed)
Alexis Holmes [id number] [birth year]  CARE TEAM:  PCP: [facility], [place]  Outpatient Care Team: Patient Care Team: [facility], [place] as PCP - General (Internal Medicine) Richmond Campbell, MD as Consulting Physician (Gastroenterology)  Inpatient Treatment Team: Treatment Team: Attending Provider: Aileen Fass, Tammi Klippel, MD; Consulting Physician: Edison Pace, Md, MD; Rounding Team: Ian Bushman, MD; Technician: Antoine Primas; Registered Nurse: Annitta Jersey, RN; Utilization Review: Claudie Leach, RN; Registered Nurse: Starleen Blue, Sharyn Lull, RN   Problem List:   Principal Problem:   Ileitis, regional [facility]) Active Problems:   Arthritis   Vitamin D deficiency   Sepsis due to undetermined organism [facility])   History of colonic polyps   Periappendicitis   Nausea & vomiting      * No surgery found *      Assessment  Stabilizing but guarded  [facility] Stay = 2 days)  Plan:  Right lower quadrant pain and peritonitis seems to be abating but still with some distention and nausea vomiting raises concern of partial small bowel obstruction.  The fact that she is starting to pass gas and had some loose bowel movements most likely means that is resolving.  I reviewed the CAT scan with Dr. Georgette Dover and Dr. Dema Severin yesterday morning.  Given the fact that she had underwent on colonoscopy per her in 2019, with no evidence of inflammatory bowel disease or Crohn's, I would favor this is more likely a complex appendicitis.  Things seem to be getting better with IV antibiotics and bowel rest.  I will continue that.  Usually I prefer piperacillin/tazobactam for abdominal issues such as appendicitis and diverticulitis, but she seems to be getting better on cefepime/metronidazole so we will continue that.  Follow CBC.  It did normalize yesterday but she is not out of the woods.  Suspect patient would benefit from repeat CAT scan in a few days to rule out abscess development and  assess if peritonitis is resolving or if it is more consistent with Crohn's ileitis.  Would plan on Monday, 5/22  If CAT scan improved, that is reassuring we can advance diet switch to oral antibiotics.  Most likely would plan interval colonoscopy and appendectomy versus ileocecectomy in 6 weeks once inflammation and infection has gone down.  If work-up more consistent with Crohn's ileitis, then consult gastroenterology for further inputs.  Dr. Earlean Shawl with her gastrologist who is since retired.  Would have to figure out who she would prefer.  If she has an abscess, place a drain.  If she decompensates it more, laparoscopic possible open exploration with appendectomy probable ileocolectomy.  Hopefully less likely since peritonitis and shock and leukocytosis seem to be waning  Switch narcotics on a hypothesis is that which made her nauseated when she received her IV Dilaudid.  Switch to morphine for now.  If she vomits again, place NG tube and bowel rest.  IV fluids.  Aggressive nausea pain control.  VTE prophylaxis- SCDs, etc  mobilize as tolerated to help recovery  Disposition: Per primary service.       I reviewed nursing notes, hospitalist notes, last 24 h vitals and pain scores, last 48 h intake and output, last 24 h labs and trends, and last 24 h imaging results. I have reviewed this patient's available data, including medical history, events of note, test results, etc as part of my evaluation.  A significant portion of that time was spent in counseling.  Care during the described time interval was provided by me.  This  care required moderate level of medical decision making.  01/11/2022    Subjective: (Chief complaint)  Patient had nausea vomiting last night -she suspects it was due to receiving IV Dilaudid.  Feels much better.  Not nauseated now.  Started passing gas with loose bowel movements this morning.  She feels like her pain is definitely less.  Transfer ordered  for floor but ICU given concerns of emesis and discomfort last night.  ICU nursing just outside room and nursing team bathing her.  Close friend in room.  Objective:  Vital signs:  Vitals:   01/11/22 0323 01/11/22 0400 01/11/22 0500 01/11/22 0600  BP:  (!) 120/53 (!) 94/55 (!) 100/48  Pulse:  94 81 81  Resp:  (!) 31 19 (!) 25  Temp: 98.8 F (37.1 C)     TempSrc: Oral     SpO2:  97% 97% 96%  Weight:      Height:           Intake/Output   Yesterday:  05/19 0701 - 05/20 0700 In: 4275.7 [I.V.:2823.2; IV Piggyback:1452.4] Out: 825 [Urine:825] This shift:  No intake/output data recorded.  Bowel function:  Flatus: YES  BM:  YES  Drain: (No drain)   Physical Exam:  General: Pt awake/alert in mild acute distress Eyes: PERRL, normal EOM.  Sclera clear.  No icterus Neuro: CN II-XII intact w/o focal sensory/motor deficits. Lymph: No head/neck/groin lymphadenopathy Psych:  No delerium/psychosis/paranoia.  Little frazzled but calm.  Oriented x 4 HENT: Normocephalic, Mucus membranes moist.  No thrush Neck: Supple, No tracheal deviation.  No obvious thyromegaly Chest: No pain to chest wall compression.  Good respiratory excursion.  No audible wheezing CV:  Pulses intact.  Regular rhythm.  No major extremity edema MS: Normal AROM mjr joints.  No obvious deformity  Abdomen: Somewhat firm.  Moderately distended.  Tenderness at RLQ - mild.  No guarding or rebound tenderness .  No evidence of peritonitis.  No incarcerated hernias.  Ext:   No deformity.  No mjr edema.  No cyanosis Skin: No petechiae / purpurea.  No major sores.  Warm and dry    Results:   Cultures: Recent Results (from the past 720 hour(s))  Urine Culture     Status: None   Collection Time: 01/09/22 11:09 AM   Specimen: Urine, Clean Catch  Result Value Ref Range Status   Specimen Description   Final    URINE, CLEAN CATCH Performed at [facility], [address] [place], [place] [address]     Special Requests   Final    NONE Performed at [facility], [address] [place], [place] [address]    Culture   Final    NO GROWTH Performed at [facility], [place] [address] [place], [place] [address]    Report Status 01/10/2022 FINAL  Final  Blood culture (routine x 2)     Status: None (Preliminary result)   Collection Time: 01/09/22 11:09 AM   Specimen: Left Antecubital; Blood  Result Value Ref Range Status   Specimen Description   Final    LEFT ANTECUBITAL Performed at [facility], [address] [place], [place] [address]    Special Requests   Final    BOTTLES DRAWN AEROBIC AND ANAEROBIC Blood Culture adequate volume Performed at [facility], [address] [place], [place] [address]    Culture   Final    NO GROWTH <  24 HOURS Performed at [facility], [place] [address] [place], [place] [address]    Report Status PENDING  Incomplete  Blood culture (routine x 2)     Status: None (Preliminary result)   Collection Time: 01/09/22 11:09 AM   Specimen: Right Antecubital; Blood  Result Value Ref Range Status   Specimen Description   Final    RIGHT ANTECUBITAL Performed at [facility], [address] [place], [place] [address]    Special Requests   Final    BOTTLES DRAWN AEROBIC AND ANAEROBIC Blood Culture adequate volume Performed at [facility], [address] [place], [place] [address]    Culture   Final    NO GROWTH < 24 HOURS Performed at [facility], [place] [address], [place], [place] [address]    Report Status PENDING  Incomplete  MRSA Next Gen by PCR, Nasal     Status: None   Collection Time: 01/10/22  5:43 AM   Specimen: Nasal Mucosa; Nasal Swab  Result Value Ref Range Status   MRSA by PCR Next Gen NOT DETECTED NOT DETECTED Final    Comment: (NOTE) The GeneXpert MRSA Assay (FDA approved for NASAL specimens only), is one component of a comprehensive MRSA colonization  surveillance program. It is not intended to diagnose MRSA infection nor to guide or monitor treatment for MRSA infections. Test performance is not FDA approved in patients less than 15 years old. Performed at [facility], [place] [address], [place], [place] [address]     Labs: Results for orders placed or performed during the hospital encounter of 01/09/22 (from the past 48 hour(s))  Comprehensive metabolic panel     Status: Abnormal   Collection Time: 01/09/22 10:35 AM  Result Value Ref Range   Sodium 134 (L) 135 - 145 mmol/L   Potassium 3.8 3.5 - 5.1 mmol/L   Chloride 97 (L) 98 - 111 mmol/L   CO2 24 22 - 32 mmol/L   Glucose, Bld 106 (H) 70 - 99 mg/dL    Comment: Glucose reference range applies only to samples taken after fasting for at least 8 hours.   BUN 16 8 - 23 mg/dL   Creatinine, Ser 1.36 (H) 0.44 - 1.00 mg/dL   Calcium 9.0 8.9 - 10.3 mg/dL   Total Protein 7.5 6.5 - 8.1 g/dL   Albumin 4.1 3.5 - 5.0 g/dL   AST 26 15 - 41 U/L   ALT 17 0 - 44 U/L   Alkaline Phosphatase 63 38 - 126 U/L   Total Bilirubin 1.3 (H) 0.3 - 1.2 mg/dL   GFR, Estimated 41 (L) >60 mL/min    Comment: (NOTE) Calculated using the CKD-EPI Creatinine Equation (2021)    Anion gap 13 5 - 15    Comment: Performed at [facility], [address] [place], [place] [address]  CBC with Differential     Status: Abnormal   Collection Time: 01/09/22 10:35 AM  Result Value Ref Range   WBC 20.4 (H) 4.0 - 10.5 K/uL   RBC 4.12 3.87 - 5.11 MIL/uL   Hemoglobin 12.9 12.0 - 15.0 g/dL   HCT 38.4 36.0 - 46.0 %   MCV 93.2 80.0 - 100.0 fL   MCH 31.3 26.0 - 34.0 pg   MCHC 33.6 30.0 - 36.0 g/dL   RDW 12.3 11.5 - 15.5 %   Platelets 315 150 - 400 K/uL   nRBC 0.0 0.0 -  0.2 %   Neutrophils Relative % 85 %   Neutro Abs 17.4 (H) 1.7 - 7.7 K/uL   Lymphocytes Relative 7 %   Lymphs Abs 1.4 0.7 - 4.0 K/uL   Monocytes Relative 7 %   Monocytes Absolute 1.4 (H) 0.1 - 1.0 K/uL   Eosinophils Relative  0 %   Eosinophils Absolute 0.0 0.0 - 0.5 K/uL   Basophils Relative 0 %   Basophils Absolute 0.1 0.0 - 0.1 K/uL   Immature Granulocytes 1 %   Abs Immature Granulocytes 0.18 (H) 0.00 - 0.07 K/uL    Comment: Performed at [facility], [address] [place], [place] [address]  Lipase, blood     Status: None   Collection Time: 01/09/22 10:35 AM  Result Value Ref Range   Lipase 37 11 - 51 U/L    Comment: Performed at [facility], [address] [place], [place] [address]  Magnesium     Status: None   Collection Time: 01/09/22 10:35 AM  Result Value Ref Range   Magnesium 2.2 1.7 - 2.4 mg/dL    Comment: Performed at [facility], [address] [place], [place] [address]  Phosphorus     Status: None   Collection Time: 01/09/22 10:35 AM  Result Value Ref Range   Phosphorus 3.0 2.5 - 4.6 mg/dL    Comment: Performed at [facility], [address] [place], [place] [address]  Urinalysis, Routine w reflex microscopic Urine, Clean Catch     Status: Abnormal   Collection Time: 01/09/22 11:09 AM  Result Value Ref Range   Color, Urine YELLOW YELLOW   APPearance CLEAR CLEAR   Specific Gravity, Urine <=1.005 1.005 - 1.030   pH 6.5 5.0 - 8.0   Glucose, UA NEGATIVE NEGATIVE mg/dL   Hgb urine dipstick TRACE (A) NEGATIVE   Bilirubin Urine NEGATIVE NEGATIVE   Ketones, ur 15 (A) NEGATIVE mg/dL   Protein, ur 30 (A) NEGATIVE mg/dL   Nitrite NEGATIVE NEGATIVE   Leukocytes,Ua NEGATIVE NEGATIVE    Comment: Performed at [facility], [address] [place], [place] [address]  Lactic acid, plasma     Status: None   Collection Time: 01/09/22 11:09 AM  Result Value Ref Range   Lactic Acid, Venous 1.9 0.5 - 1.9 mmol/L    Comment: Performed at [facility], [address] [place], [place] [address]  Urine Culture     Status: None   Collection Time: 01/09/22 11:09 AM   Specimen: Urine, Clean Catch  Result Value Ref Range   Specimen  Description      URINE, CLEAN CATCH Performed at [facility], [address] [place], [place] [address]    Special Requests      NONE Performed at [facility], [address] [place], [place] [address]    Culture      NO GROWTH Performed at [facility], [place] [address], [place], [place] [address]    Report Status 01/10/2022 FINAL   Blood culture (routine x 2)     Status: None (Preliminary result)   Collection Time: 01/09/22 11:09 AM   Specimen: Left Antecubital; Blood  Result Value Ref Range   Specimen Description      LEFT ANTECUBITAL Performed at [facility], [address] [place], [place] [address]    Special Requests  BOTTLES DRAWN AEROBIC AND ANAEROBIC Blood Culture adequate volume Performed at [facility], [address] [place], [place] [address]    Culture      NO GROWTH < 24 HOURS Performed at [facility], [place] [address], [place], [place] [address]    Report Status PENDING   Blood culture (routine x 2)     Status: None (Preliminary result)   Collection Time: 01/09/22 11:09 AM   Specimen: Right Antecubital; Blood  Result Value Ref Range   Specimen Description      RIGHT ANTECUBITAL Performed at [facility], [address] [place], [place] [address]    Special Requests      BOTTLES DRAWN AEROBIC AND ANAEROBIC Blood Culture adequate volume Performed at [facility], [address] [place], [place] [address]    Culture      NO GROWTH < 24 HOURS Performed at [facility], [place] [address], [place], [place] [address]    Report Status PENDING   Urinalysis, Microscopic (reflex)     Status: Abnormal   Collection Time: 01/09/22 11:09 AM  Result Value Ref Range   RBC / HPF 0-5 0 - 5 RBC/hpf   WBC, UA 0-5 0 - 5 WBC/hpf   Bacteria, UA RARE (A) NONE SEEN   Squamous Epithelial / LPF 0-5 0 - 5    Comment: Performed at [facility], [address] [place], [place] [address]  Lactic acid, plasma     Status: Abnormal   Collection Time: 01/09/22 12:51 PM  Result Value Ref Range   Lactic Acid, Venous 2.8 (HH) 0.5 - 1.9 mmol/L    Comment: CRITICAL RESULT CALLED TO, READ BACK BY AND VERIFIED WITH: LOUIE CAPA RN '@[id number]'$  01/09/2022 OLSONM Performed at [facility], [address] [place], [place] [address]   Lactic acid, plasma     Status: Abnormal   Collection Time: 01/09/22  5:47 PM  Result Value Ref Range   Lactic Acid, Venous 2.2 (HH) 0.5 - 1.9 mmol/L    Comment: CRITICAL RESULT CALLED TO, READ BACK BY AND VERIFIED WITH: RN D MAYS AT 1821 01/09/22 CRUICKSHANK A Performed at [facility], [place] [address] [place], [place] [address]   CBC     Status: Abnormal   Collection Time: 01/10/22  4:29 AM  Result Value Ref Range   WBC 9.6 4.0 - 10.5 K/uL   RBC 3.16 (L) 3.87 - 5.11 MIL/uL   Hemoglobin 10.1 (L) 12.0 - 15.0 g/dL   HCT 29.6 (L) 36.0 - 46.0 %   MCV 93.7 80.0 - 100.0 fL   MCH 32.0 26.0 - 34.0 pg   MCHC 34.1 30.0 - 36.0 g/dL   RDW 12.6 11.5 - 15.5 %   Platelets 210 150 - 400 K/uL   nRBC 0.0 0.0 - 0.2 %    Comment: Performed at [facility], [place] [address], [place], [place] [address]  Basic metabolic panel     Status: Abnormal   Collection Time: 01/10/22  4:29 AM  Result Value Ref Range   Sodium 135 135 - 145 mmol/L   Potassium 5.1 3.5 - 5.1 mmol/L   Chloride 107 98 - 111 mmol/L   CO2 21 (L) 22 - 32 mmol/L   Glucose, Bld 84 70 - 99 mg/dL    Comment: Glucose reference range applies only to samples taken after fasting for at least  8 hours.   BUN 18 8 - 23 mg/dL   Creatinine, Ser 1.08 (H) 0.44 - 1.00 mg/dL   Calcium 7.5 (L) 8.9 - 10.3 mg/dL   GFR, Estimated 55 (L) >60 mL/min    Comment: (NOTE) Calculated using the CKD-EPI Creatinine Equation (2021)    Anion gap 7 5 - 15    Comment: Performed at [facility], [place] [address] [place], [place] [address]  MRSA Next Gen by  PCR, Nasal     Status: None   Collection Time: 01/10/22  5:43 AM   Specimen: Nasal Mucosa; Nasal Swab  Result Value Ref Range   MRSA by PCR Next Gen NOT DETECTED NOT DETECTED    Comment: (NOTE) The GeneXpert MRSA Assay (FDA approved for NASAL specimens only), is one component of a comprehensive MRSA colonization surveillance program. It is not intended to diagnose MRSA infection nor to guide or monitor treatment for MRSA infections. Test performance is not FDA approved in patients less than 41 years old. Performed at [facility], [place] [address] [place], [place] [address]   Brain natriuretic peptide     Status: Abnormal   Collection Time: 01/11/22  2:45 AM  Result Value Ref Range   B Natriuretic Peptide 263.2 (H) 0.0 - 100.0 pg/mL    Comment: Performed at [facility], [place] [address] [place], [place] [address]  Basic metabolic panel     Status: Abnormal   Collection Time: 01/11/22  2:50 AM  Result Value Ref Range   Sodium 140 135 - 145 mmol/L   Potassium 3.9 3.5 - 5.1 mmol/L    Comment: DELTA CHECK NOTED   Chloride 111 98 - 111 mmol/L   CO2 19 (L) 22 - 32 mmol/L   Glucose, Bld 72 70 - 99 mg/dL    Comment: Glucose reference range applies only to samples taken after fasting for at least 8 hours.   BUN 19 8 - 23 mg/dL   Creatinine, Ser 1.11 (H) 0.44 - 1.00 mg/dL   Calcium 7.2 (L) 8.9 - 10.3 mg/dL   GFR, Estimated 53 (L) >60 mL/min    Comment: (NOTE) Calculated using the CKD-EPI Creatinine Equation (2021)    Anion gap 10 5 - 15    Comment: Performed at [facility], [place] [address], [place], [place] [address]    Imaging / Studies: DG Abdomen 1 View  Result Date: 01/09/2022 CLINICAL DATA:  Lower abdominal pain, possible bowel impaction EXAM: ABDOMEN - 1 VIEW COMPARISON:  Prior abdominal radiograph 03/12/2020 FINDINGS: No large free air. No focal bowel dilation to suggest obstruction. The colonic stool burden is low. No  evidence of fecal impaction. Gas noted within the sigmoid colon. Lung bases are clear. No abnormal calcifications. IMPRESSION: 1. No evidence of obstruction. 2. Low/normal colonic stool burden.  No evidence of fecal impaction. Electronically Signed   By: Jacqulynn Cadet M.D.   On: 01/09/2022 08:52   CT ABDOMEN PELVIS W CONTRAST  Result Date: 01/09/2022 CLINICAL DATA:  Abdominal pain, acute, nonlocalized EXAM: CT ABDOMEN AND PELVIS WITH CONTRAST TECHNIQUE: Multidetector CT imaging of the abdomen and pelvis was performed using the standard protocol following bolus administration of intravenous contrast. RADIATION DOSE REDUCTION: This exam was performed according to the departmental dose-optimization program which includes automated exposure control, adjustment of the mA and/or kV according to patient size and/or use of iterative reconstruction technique. CONTRAST:  153m OMNIPAQUE IOHEXOL 300 MG/ML  SOLN COMPARISON:  CT 02/13/2020, 11/27/2016  FINDINGS: Lower chest: Included lung bases are clear.  Heart size is normal. Hepatobiliary: No focal liver abnormality is seen. No gallstones, gallbladder wall thickening, or biliary dilatation. Pancreas: Unremarkable. No pancreatic ductal dilatation or surrounding inflammatory changes. Spleen: Normal in size without focal abnormality. Adrenals/Urinary Tract: Unremarkable adrenal glands. Small right renal cysts, stable from 2018 and benign. Kidneys enhance symmetrically. No renal stone or hydronephrosis. Urinary bladder is incompletely distended. Stomach/Bowel: Markedly abnormal appearance of the terminal ileum with severe wall thickening, mucosal hyperenhancement, and submucosal edema (series 5, images 48-51). The appendix is seen adjacent to the terminal ileum and also appears inflamed (series 5, images 34-46). Adjacent cecum and small bowel loops also appear mildly inflamed. No dilated loops of bowel to suggest obstruction. Stomach within normal limits.  Vascular/Lymphatic: Scattered aortoiliac atherosclerotic calcifications without aneurysm. No abdominopelvic lymphadenopathy. Reproductive: Inflammatory changes within the right adnexal region. Right ovary is difficult to discern. Uterus and left adnexa are unremarkable. Other: No pneumoperitoneum.  No abdominal wall hernia. Musculoskeletal: No acute or significant osseous findings. IMPRESSION: 1. Severely inflamed terminal ileum. The appendix is seen adjacent to the terminal ileum and is also severely inflamed. Adjacent cecum and small bowel loops also appear mildly inflamed. Findings are favored to represent a nonspecific infectious or inflammatory terminal ileitis including Crohn's disease. A reactive terminal ileitis secondary to adjacent acute appendicitis is also a consideration. No evidence of perforation or well-defined/drainable fluid collection. Surgical and/or GI consultation is recommended. 2. Inflammatory changes within the right adnexal region. Right ovary is difficult to discern. The possibility of an entero-ovarian fistulized not excluded. Aortic Atherosclerosis (ICD10-I70.0). Electronically Signed   By: Davina Poke D.O.   On: 01/09/2022 11:40   DG CHEST PORT 1 VIEW  Result Date: 01/11/2022 CLINICAL DATA:  Hypoxia EXAM: PORTABLE CHEST 1 VIEW COMPARISON:  01/09/2022 FINDINGS: The lung volumes are small, but are stable since prior examination. Bibasilar opacification likely relates to the presence of small bilateral pleural effusions, new since prior examination. Associated bibasilar atelectasis. No pneumothorax. Cardiac size within normal limits. Pulmonary vascularity is normal. No acute bone abnormality. IMPRESSION: Stable pulmonary hypoinflation. Interval development of probable small bilateral pleural effusions. Electronically Signed   By: Fidela Salisbury M.D.   On: 01/11/2022 03:14   DG CHEST PORT 1 VIEW  Result Date: 01/09/2022 CLINICAL DATA:  Hypoxia EXAM: PORTABLE CHEST 1 VIEW  COMPARISON:  CT from 02/01/2018 FINDINGS: Cardiac shadow is within normal limits. The lungs are well aerated bilaterally. Patchy atelectatic changes are noted bilaterally. No sizable effusion is noted. No bony abnormality is seen. IMPRESSION: Bibasilar atelectasis. Electronically Signed   By: Inez Catalina M.D.   On: 01/09/2022 22:43    Medications / Allergies: per chart  Antibiotics: Anti-infectives (From admission, onward)    Start     Dose/Rate Route Frequency Ordered Stop   01/09/22 2330  ceFEPIme (MAXIPIME) 2 g in sodium chloride 0.9 % 100 mL IVPB        2 g 200 mL/hr over 30 Minutes Intravenous Every 12 hours 01/09/22 1133     01/09/22 2200  metroNIDAZOLE (FLAGYL) IVPB 500 mg        500 mg 100 mL/hr over 60 Minutes Intravenous Every 12 hours 01/09/22 1402     01/09/22 1915  metroNIDAZOLE (FLAGYL) IVPB 500 mg  Status:  Discontinued        500 mg 100 mL/hr over 60 Minutes Intravenous Every 8 hours 01/09/22 1826 01/09/22 1828   01/09/22 1115  ceFEPIme (MAXIPIME) 2 g in  sodium chloride 0.9 % 100 mL IVPB        2 g 200 mL/hr over 30 Minutes Intravenous  Once 01/09/22 1109 01/09/22 1156   01/09/22 1115  metroNIDAZOLE (FLAGYL) IVPB 500 mg        500 mg 100 mL/hr over 60 Minutes Intravenous  Once 01/09/22 1109 01/09/22 1224         Note: Portions of this report may have been transcribed using voice recognition software. Every effort was made to ensure accuracy; however, inadvertent computerized transcription errors may be present.   Any transcriptional errors that result from this process are unintentional.    Adin Hector, MD, FACS, MASCRS Esophageal, Gastrointestinal & Colorectal Surgery Robotic and Minimally Invasive Surgery  [facility], [place]  [place]. [address], [place], [place] [address] [phone] Fax [phone] Main  CONTACT INFORMATION:  Weekday (9AM-5PM): Call [facility] main office at  [phone]  Weeknight (5PM-9AM) or Weekend/Holiday: Check [email or web address] (password " [email or web address]") for General Surgery [facility] coverage  (Please, do not use SecureChat as it is not reliable communication to operating surgeons for immediate patient care)      01/11/2022  8:19 AM

## 2022-01-11 NOTE — Progress Notes (Signed)
Overnight patient received PRN dilaudid for abdominal pain, after receiving medicine patient had one episode of emesis. Her oxygen saturation decreased and oxygen was increased to 6L/min to maintain saturation above 90%. New chest xray ordered which showed new bilateral pleural effusion. BNP also ordered as MD concerned about possible heart failure or fluid overload. Awaiting lab results and report given to oncoming shift nurse.

## 2022-01-11 NOTE — Progress Notes (Signed)
[facility] PROGRESS NOTE    Progress Note  Alexis Holmes  LKG:[id number] DOB: January 27, 1951 DOA: 01/09/2022 PCP: Holland Commons, FNP     Brief Narrative:   Alexis Holmes is an 71 y.o. female past medical history of hyperlipidemia and previous nephrolithiasis comes in complaining of abdominal pain that started on Tuesday located in her lower quadrant it has progressively gotten worse to the point where she cannot move.  White blood cell count was 20,000 CT scan showed appendix severely inflamed with the cecum and small bowel.  Hours.   Assessment/Plan:   Sepsis due to undetermined organism due to ileitis, regional ([place]) Continue IV cefepime and Flagyl. Sepsis physiology has resolved.  Today she looks much better more upbeat, requiring less pain medication. Due to have a poor appetite she is positive about 7 L with improved urine output. Keep the patient n.p.o. Has required multiple doses of IV narcotics Blood cultures have been negative till date. Keep the patient n.p.o., diet per surgery. Further management per surgical team. We will discuss with surgery to transfer to [place].   DVT prophylaxis: lovenox Family Communication:sister Status is: Inpatient Remains inpatient appropriate because: Sepsis due to regional.    Code Status:     Code Status Orders  (From admission, onward)           Start     Ordered   01/09/22 1826  Full code  Continuous        01/09/22 1826           Code Status History     This patient has a current code status but no historical code status.      Advance Directive Documentation    Flowsheet Row Most Recent Value  Type of Advance Directive Healthcare Power of Attorney  [[name]]  Pre-existing out of facility DNR order (yellow form or pink MOST form) --  "MOST" Form in Place? --         IV Access:   Peripheral IV   Procedures and diagnostic studies:   DG Abdomen 1 View  Result Date:  01/09/2022 CLINICAL DATA:  Lower abdominal pain, possible bowel impaction EXAM: ABDOMEN - 1 VIEW COMPARISON:  Prior abdominal radiograph 03/12/2020 FINDINGS: No large free air. No focal bowel dilation to suggest obstruction. The colonic stool burden is low. No evidence of fecal impaction. Gas noted within the sigmoid colon. Lung bases are clear. No abnormal calcifications. IMPRESSION: 1. No evidence of obstruction. 2. Low/normal colonic stool burden.  No evidence of fecal impaction. Electronically Signed   By: Jacqulynn Cadet M.D.   On: 01/09/2022 08:52   CT ABDOMEN PELVIS W CONTRAST  Result Date: 01/09/2022 CLINICAL DATA:  Abdominal pain, acute, nonlocalized EXAM: CT ABDOMEN AND PELVIS WITH CONTRAST TECHNIQUE: Multidetector CT imaging of the abdomen and pelvis was performed using the standard protocol following bolus administration of intravenous contrast. RADIATION DOSE REDUCTION: This exam was performed according to the departmental dose-optimization program which includes automated exposure control, adjustment of the mA and/or kV according to patient size and/or use of iterative reconstruction technique. CONTRAST:  173m OMNIPAQUE IOHEXOL 300 MG/ML  SOLN COMPARISON:  CT 02/13/2020, 11/27/2016 FINDINGS: Lower chest: Included lung bases are clear.  Heart size is normal. Hepatobiliary: No focal liver abnormality is seen. No gallstones, gallbladder wall thickening, or biliary dilatation. Pancreas: Unremarkable. No pancreatic ductal dilatation or surrounding inflammatory changes. Spleen: Normal in size without focal abnormality. Adrenals/Urinary Tract: Unremarkable adrenal glands. Small right renal cysts, stable from 2018 and  benign. Kidneys enhance symmetrically. No renal stone or hydronephrosis. Urinary bladder is incompletely distended. Stomach/Bowel: Markedly abnormal appearance of the terminal ileum with severe wall thickening, mucosal hyperenhancement, and submucosal edema (series 5, images 48-51). The  appendix is seen adjacent to the terminal ileum and also appears inflamed (series 5, images 34-46). Adjacent cecum and small bowel loops also appear mildly inflamed. No dilated loops of bowel to suggest obstruction. Stomach within normal limits. Vascular/Lymphatic: Scattered aortoiliac atherosclerotic calcifications without aneurysm. No abdominopelvic lymphadenopathy. Reproductive: Inflammatory changes within the right adnexal region. Right ovary is difficult to discern. Uterus and left adnexa are unremarkable. Other: No pneumoperitoneum.  No abdominal wall hernia. Musculoskeletal: No acute or significant osseous findings. IMPRESSION: 1. Severely inflamed terminal ileum. The appendix is seen adjacent to the terminal ileum and is also severely inflamed. Adjacent cecum and small bowel loops also appear mildly inflamed. Findings are favored to represent a nonspecific infectious or inflammatory terminal ileitis including Crohn's disease. A reactive terminal ileitis secondary to adjacent acute appendicitis is also a consideration. No evidence of perforation or well-defined/drainable fluid collection. Surgical and/or GI consultation is recommended. 2. Inflammatory changes within the right adnexal region. Right ovary is difficult to discern. The possibility of an entero-ovarian fistulized not excluded. Aortic Atherosclerosis (ICD10-I70.0). Electronically Signed   By: Davina Poke D.O.   On: 01/09/2022 11:40   DG CHEST PORT 1 VIEW  Result Date: 01/11/2022 CLINICAL DATA:  Hypoxia EXAM: PORTABLE CHEST 1 VIEW COMPARISON:  01/09/2022 FINDINGS: The lung volumes are small, but are stable since prior examination. Bibasilar opacification likely relates to the presence of small bilateral pleural effusions, new since prior examination. Associated bibasilar atelectasis. No pneumothorax. Cardiac size within normal limits. Pulmonary vascularity is normal. No acute bone abnormality. IMPRESSION: Stable pulmonary hypoinflation.  Interval development of probable small bilateral pleural effusions. Electronically Signed   By: Fidela Salisbury M.D.   On: 01/11/2022 03:14   DG CHEST PORT 1 VIEW  Result Date: 01/09/2022 CLINICAL DATA:  Hypoxia EXAM: PORTABLE CHEST 1 VIEW COMPARISON:  CT from 02/01/2018 FINDINGS: Cardiac shadow is within normal limits. The lungs are well aerated bilaterally. Patchy atelectatic changes are noted bilaterally. No sizable effusion is noted. No bony abnormality is seen. IMPRESSION: Bibasilar atelectasis. Electronically Signed   By: Inez Catalina M.D.   On: 01/09/2022 22:43     Medical Consultants:   None.   Subjective:    [name] having bowel movements she relates she feels much better today.  Objective:    Vitals:   01/11/22 0323 01/11/22 0400 01/11/22 0500 01/11/22 0600  BP:  (!) 120/53 (!) 94/55 (!) 100/48  Pulse:  94 81 81  Resp:  (!) 31 19 (!) 25  Temp: 98.8 F (37.1 C)     TempSrc: Oral     SpO2:  97% 97% 96%  Weight:      Height:       SpO2: 96 % O2 Flow Rate (L/min): 6 L/min   Intake/Output Summary (Last 24 hours) at 01/11/2022 0707 Last data filed at 01/11/2022 0600 Gross per 24 hour  Intake 4275.65 ml  Output 825 ml  Net 3450.65 ml    Filed Weights   01/09/22 1025 01/09/22 1723  Weight: 61.2 kg 61.2 kg    Exam: General exam: In no acute distress. Respiratory system: Good air movement and clear to auscultation. Cardiovascular system: S1 & S2 heard, RRR. No JVD. Gastrointestinal system: Abdomen is nondistended, soft and nontender.  Extremities: No pedal edema. Skin: No  rashes, lesions or ulcers Psychiatry: Judgement and insight appear normal. Mood & affect appropriate.   Data Reviewed:    Labs: Basic Metabolic Panel: Recent Labs  Lab 01/09/22 1035 01/10/22 0429 01/11/22 0250  NA 134* 135 140  K 3.8 5.1 3.9  CL 97* 107 111  CO2 24 21* 19*  GLUCOSE 106* 84 72  BUN '16 18 19  '$ CREATININE 1.36* 1.08* 1.11*  CALCIUM 9.0 7.5* 7.2*  MG 2.2  --    --   PHOS 3.0  --   --     GFR Estimated Creatinine Clearance: 38.5 mL/min (A) (by C-G formula based on SCr of 1.11 mg/dL (H)). Liver Function Tests: Recent Labs  Lab 01/09/22 1035  AST 26  ALT 17  ALKPHOS 63  BILITOT 1.3*  PROT 7.5  ALBUMIN 4.1    Recent Labs  Lab 01/09/22 1035  LIPASE 37    No results for input(s): AMMONIA in the last 168 hours. Coagulation profile No results for input(s): INR, PROTIME in the last 168 hours. COVID-19 Labs  No results for input(s): DDIMER, FERRITIN, LDH, CRP in the last 72 hours.  Lab Results  Component Value Date   [place] NEGATIVE 02/16/2020    CBC: Recent Labs  Lab 01/09/22 1035 01/10/22 0429  WBC 20.4* 9.6  NEUTROABS 17.4*  --   HGB 12.9 10.1*  HCT 38.4 29.6*  MCV 93.2 93.7  PLT 315 210    Cardiac Enzymes: No results for input(s): CKTOTAL, CKMB, CKMBINDEX, TROPONINI in the last 168 hours. BNP (last 3 results) No results for input(s): PROBNP in the last 8760 hours. CBG: No results for input(s): GLUCAP in the last 168 hours. D-Dimer: No results for input(s): DDIMER in the last 72 hours. Hgb A1c: No results for input(s): HGBA1C in the last 72 hours. Lipid Profile: No results for input(s): CHOL, HDL, LDLCALC, TRIG, CHOLHDL, LDLDIRECT in the last 72 hours. Thyroid function studies: No results for input(s): TSH, T4TOTAL, T3FREE, THYROIDAB in the last 72 hours.  Invalid input(s): FREET3 Anemia work up: No results for input(s): VITAMINB12, FOLATE, FERRITIN, TIBC, IRON, RETICCTPCT in the last 72 hours. Sepsis Labs: Recent Labs  Lab 01/09/22 1035 01/09/22 1109 01/09/22 1251 01/09/22 1747 01/10/22 0429  WBC 20.4*  --   --   --  9.6  LATICACIDVEN  --  1.9 2.8* 2.2*  --     Microbiology Recent Results (from the past 240 hour(s))  Urine Culture     Status: None   Collection Time: 01/09/22 11:09 AM   Specimen: Urine, Clean Catch  Result Value Ref Range Status   Specimen Description   Final    URINE,  CLEAN CATCH Performed at [facility], [address] [place], [place] [address]    Special Requests   Final    NONE Performed at [facility], [address] [place], [place] [address]    Culture   Final    NO GROWTH Performed at [facility], [place] [address], [place], [place] [address]    Report Status 01/10/2022 FINAL  Final  Blood culture (routine x 2)     Status: None (Preliminary result)   Collection Time: 01/09/22 11:09 AM   Specimen: Left Antecubital; Blood  Result Value Ref Range Status   Specimen Description   Final    LEFT ANTECUBITAL Performed at [facility], [address], [place], [place] [address]    Special Requests   Final  BOTTLES DRAWN AEROBIC AND ANAEROBIC Blood Culture adequate volume Performed at [facility], [address] [place], [place] [address]    Culture   Final    NO GROWTH < 24 HOURS Performed at [facility], [place] [address] [place], [place] [address]    Report Status PENDING  Incomplete  Blood culture (routine x 2)     Status: None (Preliminary result)   Collection Time: 01/09/22 11:09 AM   Specimen: Right Antecubital; Blood  Result Value Ref Range Status   Specimen Description   Final    RIGHT ANTECUBITAL Performed at [facility], [address] [place], [place] [address]    Special Requests   Final    BOTTLES DRAWN AEROBIC AND ANAEROBIC Blood Culture adequate volume Performed at [facility], [address] [place], [place] [address]    Culture   Final    NO GROWTH < 24 HOURS Performed at [facility], [place] [address] [place], [place] [address]    Report Status PENDING  Incomplete  MRSA Next Gen by PCR, Nasal     Status: None   Collection Time: 01/10/22  5:43 AM   Specimen: Nasal Mucosa; Nasal Swab  Result Value Ref Range Status   MRSA by PCR Next Gen NOT DETECTED NOT DETECTED Final    Comment: (NOTE) The GeneXpert MRSA Assay (FDA  approved for NASAL specimens only), is one component of a comprehensive MRSA colonization surveillance program. It is not intended to diagnose MRSA infection nor to guide or monitor treatment for MRSA infections. Test performance is not FDA approved in patients less than 24 years old. Performed at [facility], [place] [address], [place], [place] [address]      Medications:    Chlorhexidine Gluconate Cloth  6 each Topical Daily   enoxaparin (LOVENOX) injection  40 mg Subcutaneous Q24H   mouth rinse  15 mL Mouth Rinse BID   Continuous Infusions:  sodium chloride 100 mL/hr at 01/11/22 0600   ceFEPime (MAXIPIME) IV Stopped (01/10/22 2351)   metronidazole Stopped (01/10/22 2303)   ondansetron (ZOFRAN) IV Stopped (01/11/22 0243)      LOS: 2 days   Charlynne Cousins  [facility]  01/11/2022, 7:07 AM

## 2022-01-12 ENCOUNTER — Inpatient Hospital Stay (HOSPITAL_COMMUNITY): Payer: Medicare Other

## 2022-01-12 DIAGNOSIS — K50019 Crohn's disease of small intestine with unspecified complications: Secondary | ICD-10-CM | POA: Diagnosis not present

## 2022-01-12 DIAGNOSIS — K5000 Crohn's disease of small intestine without complications: Secondary | ICD-10-CM | POA: Diagnosis not present

## 2022-01-12 DIAGNOSIS — A419 Sepsis, unspecified organism: Secondary | ICD-10-CM | POA: Diagnosis not present

## 2022-01-12 LAB — BLOOD CULTURE ID PANEL (REFLEXED) - BCID2

## 2022-01-12 LAB — CBC
HCT: 31.4 % — ABNORMAL LOW (ref 36.0–46.0)
Hemoglobin: 10.6 g/dL — ABNORMAL LOW (ref 12.0–15.0)
MCH: 31.6 pg (ref 26.0–34.0)
MCHC: 33.8 g/dL (ref 30.0–36.0)
MCV: 93.7 fL (ref 80.0–100.0)
Platelets: 334 10*3/uL (ref 150–400)
RBC: 3.35 MIL/uL — ABNORMAL LOW (ref 3.87–5.11)
RDW: 13.2 % (ref 11.5–15.5)
WBC: 16.7 10*3/uL — ABNORMAL HIGH (ref 4.0–10.5)
nRBC: 0 % (ref 0.0–0.2)

## 2022-01-12 LAB — BASIC METABOLIC PANEL
Anion gap: 9 (ref 5–15)
BUN: 19 mg/dL (ref 8–23)
CO2: 18 mmol/L — ABNORMAL LOW (ref 22–32)
Calcium: 7.7 mg/dL — ABNORMAL LOW (ref 8.9–10.3)
Chloride: 113 mmol/L — ABNORMAL HIGH (ref 98–111)
Creatinine, Ser: 1.08 mg/dL — ABNORMAL HIGH (ref 0.44–1.00)
GFR, Estimated: 55 mL/min — ABNORMAL LOW (ref >60)
Glucose, Bld: 87 mg/dL (ref 70–99)
Potassium: 3.3 mmol/L — ABNORMAL LOW (ref 3.5–5.1)
Sodium: 140 mmol/L (ref 135–145)

## 2022-01-12 LAB — C DIFFICILE (CDIFF) QUICK SCRN (NO PCR REFLEX)
C Diff antigen: NEGATIVE
C Diff interpretation: NOT DETECTED
C Diff toxin: NEGATIVE

## 2022-01-12 MED ORDER — PIPERACILLIN-TAZOBACTAM 3.375 G IVPB
3.3750 g | Freq: Three times a day (TID) | INTRAVENOUS | Status: DC
  Administered 2022-01-12 – 2022-01-17 (×15): 3.375 g via INTRAVENOUS
  Filled 2022-01-12 (×15): qty 50

## 2022-01-12 MED ORDER — POTASSIUM CHLORIDE 2 MEQ/ML IV SOLN: INTRAVENOUS | Status: DC

## 2022-01-12 MED ORDER — POTASSIUM CHLORIDE 20 MEQ PO PACK: 40.0000 meq | PACK | Freq: Two times a day (BID) | ORAL | Status: DC

## 2022-01-12 MED ORDER — SODIUM CHLORIDE (PF) 0.9 % IJ SOLN
INTRAMUSCULAR | Status: AC
  Filled 2022-01-12: qty 50

## 2022-01-12 MED ORDER — KCL IN DEXTROSE-NACL 40-5-0.45 MEQ/L-%-% IV SOLN
INTRAVENOUS | Status: AC
Start: 2022-01-12 — End: 2022-01-13
  Filled 2022-01-12 (×2): qty 1000

## 2022-01-12 MED ORDER — IOHEXOL 300 MG/ML  SOLN
100.0000 mL | Freq: Once | INTRAMUSCULAR | Status: AC | PRN
  Administered 2022-01-12: 100 mL via INTRAVENOUS

## 2022-01-12 NOTE — Progress Notes (Signed)
[facility] PROGRESS NOTE    Progress Note  Alexis Holmes  PJA:[id number] DOB: July 09, 1951 DOA: 01/09/2022 PCP: Holland Commons, FNP     Brief Narrative:   Alexis Holmes is an 71 y.o. female past medical history of hyperlipidemia and previous nephrolithiasis comes in complaining of abdominal pain that started on Tuesday located in her lower quadrant it has progressively gotten worse to the point where she cannot move.  White blood cell count was 20,000 CT scan showed appendix severely inflamed with the cecum and small bowel.  Hours.   Assessment/Plan:   Sepsis due to undetermined organism due to ileitis, regional ([place]) Continue IV cefepime and Flagyl. Sepsis physiology has resolved.   General surgery was consulted recommended a CT of the abdomen pelvis is pending. Her diet has been advanced.  Moderate urine output Continue IV narcotics. Blood cultures remain negative till date.  Further management per general surgery.   DVT prophylaxis: lovenox Family Communication:sister Status is: Inpatient Remains inpatient appropriate because: Sepsis due to regional.    Code Status:     Code Status Orders  (From admission, onward)           Start     Ordered   01/09/22 1826  Full code  Continuous        01/09/22 1826           Code Status History     This patient has a current code status but no historical code status.      Advance Directive Documentation    Flowsheet Row Most Recent Value  Type of Advance Directive Healthcare Power of Attorney  [[name]]  Pre-existing out of facility DNR order (yellow form or pink MOST form) --  "MOST" Form in Place? --         IV Access:   Peripheral IV   Procedures and diagnostic studies:   DG CHEST PORT 1 VIEW  Result Date: 01/11/2022 CLINICAL DATA:  Hypoxia EXAM: PORTABLE CHEST 1 VIEW COMPARISON:  01/09/2022 FINDINGS: The lung volumes are small, but are stable since prior examination. Bibasilar  opacification likely relates to the presence of small bilateral pleural effusions, new since prior examination. Associated bibasilar atelectasis. No pneumothorax. Cardiac size within normal limits. Pulmonary vascularity is normal. No acute bone abnormality. IMPRESSION: Stable pulmonary hypoinflation. Interval development of probable small bilateral pleural effusions. Electronically Signed   By: Fidela Salisbury M.D.   On: 01/11/2022 03:14     Medical Consultants:   None.   Subjective:    Alexis Holmes continues to have bowel movement, relates she is tolerating her diet.  Objective:    Vitals:   01/11/22 1739 01/11/22 2214 01/12/22 0243 01/12/22 0444  BP: 127/60 123/66 130/66 133/70  Pulse: 99 96 (!) 103 (!) 101  Resp:  '16 16 18  '$ Temp: 98.2 F (36.8 C) 99.4 F (37.4 C) 98.1 F (36.7 C) 99.7 F (37.6 C)  TempSrc: Oral Oral Oral Oral  SpO2: 92% 93% 91% 92%  Weight:      Height:       SpO2: 92 % O2 Flow Rate (L/min): 3 L/min   Intake/Output Summary (Last 24 hours) at 01/12/2022 0924 Last data filed at 01/12/2022 0500 Gross per 24 hour  Intake 230.9 ml  Output 700 ml  Net -469.1 ml    Filed Weights   01/09/22 1025 01/09/22 1723  Weight: 61.2 kg 61.2 kg    Exam: General exam: In no acute distress. Respiratory system: Good air movement and  clear to auscultation. Cardiovascular system: S1 & S2 heard, RRR. No JVD. Gastrointestinal system: Abdomen is nondistended, soft and nontender.  Extremities: No pedal edema. Skin: No rashes, lesions or ulcers Psychiatry: Judgement and insight appear normal. Mood & affect appropriate.   Data Reviewed:    Labs: Basic Metabolic Panel: Recent Labs  Lab 01/09/22 1035 01/10/22 0429 01/11/22 0249 01/11/22 0250 01/12/22 0529  NA 134* 135  --  140 140  K 3.8 5.1  --  3.9 3.3*  CL 97* 107  --  111 113*  CO2 24 21*  --  19* 18*  GLUCOSE 106* 84  --  72 87  BUN 16 18  --  19 19  CREATININE 1.36* 1.08*  --  1.11* 1.08*  CALCIUM  9.0 7.5*  --  7.2* 7.7*  MG 2.2  --  2.0  --   --   PHOS 3.0  --   --  2.0*  --     GFR Estimated Creatinine Clearance: 39.5 mL/min (A) (by C-G formula based on SCr of 1.08 mg/dL (H)). Liver Function Tests: Recent Labs  Lab 01/09/22 1035  AST 26  ALT 17  ALKPHOS 63  BILITOT 1.3*  PROT 7.5  ALBUMIN 4.1    Recent Labs  Lab 01/09/22 1035  LIPASE 37    No results for input(s): AMMONIA in the last 168 hours. Coagulation profile No results for input(s): INR, PROTIME in the last 168 hours. COVID-19 Labs  No results for input(s): DDIMER, FERRITIN, LDH, CRP in the last 72 hours.  Lab Results  Component Value Date   [place] NEGATIVE 02/16/2020    CBC: Recent Labs  Lab 01/09/22 1035 01/10/22 0429 01/11/22 0859 01/12/22 0529  WBC 20.4* 9.6 15.0* 16.7*  NEUTROABS 17.4*  --   --   --   HGB 12.9 10.1* 10.7* 10.6*  HCT 38.4 29.6* 33.7* 31.4*  MCV 93.2 93.7 99.7 93.7  PLT 315 210 278 334    Cardiac Enzymes: No results for input(s): CKTOTAL, CKMB, CKMBINDEX, TROPONINI in the last 168 hours. BNP (last 3 results) No results for input(s): PROBNP in the last 8760 hours. CBG: No results for input(s): GLUCAP in the last 168 hours. D-Dimer: No results for input(s): DDIMER in the last 72 hours. Hgb A1c: No results for input(s): HGBA1C in the last 72 hours. Lipid Profile: No results for input(s): CHOL, HDL, LDLCALC, TRIG, CHOLHDL, LDLDIRECT in the last 72 hours. Thyroid function studies: No results for input(s): TSH, T4TOTAL, T3FREE, THYROIDAB in the last 72 hours.  Invalid input(s): FREET3 Anemia work up: No results for input(s): VITAMINB12, FOLATE, FERRITIN, TIBC, IRON, RETICCTPCT in the last 72 hours. Sepsis Labs: Recent Labs  Lab 01/09/22 1035 01/09/22 1109 01/09/22 1251 01/09/22 1747 01/10/22 0429 01/11/22 0859 01/12/22 0529  WBC 20.4*  --   --   --  9.6 15.0* 16.7*  LATICACIDVEN  --  1.9 2.8* 2.2*  --   --   --     Microbiology Recent Results (from  the past 240 hour(s))  Urine Culture     Status: None   Collection Time: 01/09/22 11:09 AM   Specimen: Urine, Clean Catch  Result Value Ref Range Status   Specimen Description   Final    URINE, CLEAN CATCH Performed at [facility], [address] [place], [place] [address]    Special Requests   Final    NONE Performed at [facility], [address] [place],  [place] [address]    Culture   Final    NO GROWTH Performed at [facility], [place] [address], [place], [place] [address]    Report Status 01/10/2022 FINAL  Final  Blood culture (routine x 2)     Status: None (Preliminary result)   Collection Time: 01/09/22 11:09 AM   Specimen: Left Antecubital; Blood  Result Value Ref Range Status   Specimen Description   Final    LEFT ANTECUBITAL Performed at [facility], [address] [place], [place] [address]    Special Requests   Final    BOTTLES DRAWN AEROBIC AND ANAEROBIC Blood Culture adequate volume Performed at [facility], [address] [place], [place] [address]    Culture  Setup Time   Final    GRAM NEGATIVE RODS ANAEROBIC BOTTLE ONLY CRITICAL RESULT CALLED TO, READ BACK BY AND VERIFIED WITH: PHARMD ELLEN JACKSON 01/12/22'@2'$ :36 BY TW Performed at [facility], [place] [address] [place], [place] [address]    Culture GRAM NEGATIVE RODS  Final   Report Status PENDING  Incomplete  Blood culture (routine x 2)     Status: None (Preliminary result)   Collection Time: 01/09/22 11:09 AM   Specimen: Right Antecubital; Blood  Result Value Ref Range Status   Specimen Description   Final    RIGHT ANTECUBITAL Performed at [facility], [address] [place], [place] [address]    Special Requests   Final    BOTTLES DRAWN AEROBIC AND ANAEROBIC Blood Culture adequate volume Performed at [facility], [address] [place], [place] [address]    Culture   Final    NO GROWTH 3 DAYS Performed at  [facility], [place] [address] [place], [place] [address]    Report Status PENDING  Incomplete  Blood Culture ID Panel (Reflexed)     Status: None   Collection Time: 01/09/22 11:09 AM  Result Value Ref Range Status   Enterococcus faecalis NOT DETECTED NOT DETECTED Final   Enterococcus Faecium NOT DETECTED NOT DETECTED Final   Listeria monocytogenes NOT DETECTED NOT DETECTED Final   Staphylococcus species NOT DETECTED NOT DETECTED Final   Staphylococcus aureus (BCID) NOT DETECTED NOT DETECTED Final   Staphylococcus epidermidis NOT DETECTED NOT DETECTED Final   Staphylococcus lugdunensis NOT DETECTED NOT DETECTED Final   Streptococcus species NOT DETECTED NOT DETECTED Final   Streptococcus agalactiae NOT DETECTED NOT DETECTED Final   Streptococcus pneumoniae NOT DETECTED NOT DETECTED Final   Streptococcus pyogenes NOT DETECTED NOT DETECTED Final   A.calcoaceticus-baumannii NOT DETECTED NOT DETECTED Final   Bacteroides fragilis NOT DETECTED NOT DETECTED Final   Enterobacterales NOT DETECTED NOT DETECTED Final   Enterobacter cloacae complex NOT DETECTED NOT DETECTED Final   Escherichia coli NOT DETECTED NOT DETECTED Final   Klebsiella aerogenes NOT DETECTED NOT DETECTED Final   Klebsiella oxytoca NOT DETECTED NOT DETECTED Final   Klebsiella pneumoniae NOT DETECTED NOT DETECTED Final   Proteus species NOT DETECTED NOT DETECTED Final   Salmonella species NOT DETECTED NOT DETECTED Final   Serratia marcescens NOT DETECTED NOT DETECTED Final   Haemophilus influenzae NOT DETECTED NOT DETECTED Final   Neisseria meningitidis NOT DETECTED NOT DETECTED Final   Pseudomonas aeruginosa NOT DETECTED NOT DETECTED Final   Stenotrophomonas maltophilia NOT DETECTED NOT DETECTED Final   Candida albicans NOT DETECTED NOT DETECTED Final   Candida auris NOT DETECTED NOT DETECTED Final  Candida glabrata NOT DETECTED NOT DETECTED Final   Candida krusei NOT DETECTED NOT DETECTED Final   Candida  parapsilosis NOT DETECTED NOT DETECTED Final   Candida tropicalis NOT DETECTED NOT DETECTED Final   Cryptococcus neoformans/gattii NOT DETECTED NOT DETECTED Final    Comment: Performed at [facility], [place] [address], [place], [place] [address]  MRSA Next Gen by PCR, Nasal     Status: None   Collection Time: 01/10/22  5:43 AM   Specimen: Nasal Mucosa; Nasal Swab  Result Value Ref Range Status   MRSA by PCR Next Gen NOT DETECTED NOT DETECTED Final    Comment: (NOTE) The GeneXpert MRSA Assay (FDA approved for NASAL specimens only), is one component of a comprehensive MRSA colonization surveillance program. It is not intended to diagnose MRSA infection nor to guide or monitor treatment for MRSA infections. Test performance is not FDA approved in patients less than 60 years old. Performed at [facility], [place] [address] [place], [place] [address]      Medications:    Chlorhexidine Gluconate Cloth  6 each Topical Daily   enoxaparin (LOVENOX) injection  40 mg Subcutaneous Q24H   lip balm  1 application. Topical BID   mouth rinse  15 mL Mouth Rinse BID   sodium chloride (PF)       Continuous Infusions:  ceFEPime (MAXIPIME) IV 2 g (01/11/22 2258)   dextrose 5 % and 0.45 % NaCl with KCl 40 mEq/L     dextrose 5 % and 0.45% NaCl 10 mL/hr at 01/11/22 1300   lactated ringers     methocarbamol (ROBAXIN) IV     metronidazole 500 mg (01/11/22 2151)   ondansetron (ZOFRAN) IV Stopped (01/11/22 0243)      LOS: 3 days   Charlynne Cousins  [facility]  01/12/2022, 9:24 AM

## 2022-01-12 NOTE — Progress Notes (Signed)
VIR note:  71 yo female admitted with abd/pelvic abscess, ruptured appendicitis.  Reviewed for possible drainage.  The only accessible component of the multi-loculated abscess is dependent pelvis.  Possible transgluteal, however is the minority of the abscess. The majority of abscess is loculated along the right pelvic sidewall where there is no CT window for perc drainage. Discussed with Dr. Brantley Stage.   If evolves further on any interval imaging, VIR can always reassess.  Signed,  Dulcy Fanny. Earleen Newport, DO

## 2022-01-12 NOTE — Progress Notes (Signed)
PHARMACY - PHYSICIAN COMMUNICATION CRITICAL VALUE ALERT - BLOOD CULTURE IDENTIFICATION (BCID)  Alexis Holmes is an 71 y.o. female who presented to [facility] on 01/09/2022 with a chief complaint of peritonitis  Assessment:  GNR, no BCID  Name of physician (or Provider) Contacted: Hal Hope  Current antibiotics: cefepime and flagyl  Changes to prescribed antibiotics recommended:  Patient is on recommended antibiotics - No changes needed  Results for orders placed or performed during the hospital encounter of 01/09/22  Blood Culture ID Panel (Reflexed) (Collected: 01/09/2022 11:09 AM)  Result Value Ref Range   Enterococcus faecalis NOT DETECTED NOT DETECTED   Enterococcus Faecium NOT DETECTED NOT DETECTED   Listeria monocytogenes NOT DETECTED NOT DETECTED   Staphylococcus species NOT DETECTED NOT DETECTED   Staphylococcus aureus (BCID) NOT DETECTED NOT DETECTED   Staphylococcus epidermidis NOT DETECTED NOT DETECTED   Staphylococcus lugdunensis NOT DETECTED NOT DETECTED   Streptococcus species NOT DETECTED NOT DETECTED   Streptococcus agalactiae NOT DETECTED NOT DETECTED   Streptococcus pneumoniae NOT DETECTED NOT DETECTED   Streptococcus pyogenes NOT DETECTED NOT DETECTED   A.calcoaceticus-baumannii NOT DETECTED NOT DETECTED   Bacteroides fragilis NOT DETECTED NOT DETECTED   Enterobacterales NOT DETECTED NOT DETECTED   Enterobacter cloacae complex NOT DETECTED NOT DETECTED   Escherichia coli NOT DETECTED NOT DETECTED   Klebsiella aerogenes NOT DETECTED NOT DETECTED   Klebsiella oxytoca NOT DETECTED NOT DETECTED   Klebsiella pneumoniae NOT DETECTED NOT DETECTED   Proteus species NOT DETECTED NOT DETECTED   Salmonella species NOT DETECTED NOT DETECTED   Serratia marcescens NOT DETECTED NOT DETECTED   Haemophilus influenzae NOT DETECTED NOT DETECTED   Neisseria meningitidis NOT DETECTED NOT DETECTED   Pseudomonas aeruginosa NOT DETECTED NOT DETECTED   Stenotrophomonas maltophilia  NOT DETECTED NOT DETECTED   Candida albicans NOT DETECTED NOT DETECTED   Candida auris NOT DETECTED NOT DETECTED   Candida glabrata NOT DETECTED NOT DETECTED   Candida krusei NOT DETECTED NOT DETECTED   Candida parapsilosis NOT DETECTED NOT DETECTED   Candida tropicalis NOT DETECTED NOT DETECTED   Cryptococcus neoformans/gattii NOT DETECTED NOT DETECTED   Dolly Rias RPh 01/12/2022, 3:53 AM

## 2022-01-12 NOTE — Progress Notes (Signed)
Subjective/Chief Complaint: Pt feels a little better having diarrhea  6 BM yesterday  Cramps noted  Now with some left sided pain      Objective: Vital signs in last 24 hours: Temp:  [98 F (36.7 C)-99.7 F (37.6 C)] 99.7 F (37.6 C) (05/21 0444) Pulse Rate:  [90-103] 101 (05/21 0444) Resp:  [16-35] 18 (05/21 0444) BP: (112-133)/(51-70) 133/70 (05/21 0444) SpO2:  [91 %-98 %] 92 % (05/21 0444) Last BM Date : 01/11/22  Intake/Output from previous day: 05/20 0701 - 05/21 0700 In: 290.9 [P.O.:60; I.V.:36.7; IV Piggyback:194.2] Out: 700 [Urine:700] Intake/Output this shift: No intake/output data recorded.  General appearance: alert and cooperative Resp: clear to auscultation bilaterally Cardio: slight tachycardia  GI: RLQ tenderness but no rebound  soft LLQ tenderness no rebound   Lab Results:  Recent Labs    01/11/22 0859 01/12/22 0529  WBC 15.0* 16.7*  HGB 10.7* 10.6*  HCT 33.7* 31.4*  PLT 278 334   BMET Recent Labs    01/11/22 0250 01/12/22 0529  NA 140 140  K 3.9 3.3*  CL 111 113*  CO2 19* 18*  GLUCOSE 72 87  BUN 19 19  CREATININE 1.11* 1.08*  CALCIUM 7.2* 7.7*   PT/INR No results for input(s): LABPROT, INR in the last 72 hours. ABG No results for input(s): PHART, HCO3 in the last 72 hours.  Invalid input(s): PCO2, PO2  Studies/Results: DG CHEST PORT 1 VIEW  Result Date: 01/11/2022 CLINICAL DATA:  Hypoxia EXAM: PORTABLE CHEST 1 VIEW COMPARISON:  01/09/2022 FINDINGS: The lung volumes are small, but are stable since prior examination. Bibasilar opacification likely relates to the presence of small bilateral pleural effusions, new since prior examination. Associated bibasilar atelectasis. No pneumothorax. Cardiac size within normal limits. Pulmonary vascularity is normal. No acute bone abnormality. IMPRESSION: Stable pulmonary hypoinflation. Interval development of probable small bilateral pleural effusions. Electronically Signed   By: Fidela Salisbury M.D.   On: 01/11/2022 03:14    Anti-infectives: Anti-infectives (From admission, onward)    Start     Dose/Rate Route Frequency Ordered Stop   01/09/22 2330  ceFEPIme (MAXIPIME) 2 g in sodium chloride 0.9 % 100 mL IVPB        2 g 200 mL/hr over 30 Minutes Intravenous Every 12 hours 01/09/22 1133     01/09/22 2200  metroNIDAZOLE (FLAGYL) IVPB 500 mg        500 mg 100 mL/hr over 60 Minutes Intravenous Every 12 hours 01/09/22 1402     01/09/22 1915  metroNIDAZOLE (FLAGYL) IVPB 500 mg  Status:  Discontinued        500 mg 100 mL/hr over 60 Minutes Intravenous Every 8 hours 01/09/22 1826 01/09/22 1828   01/09/22 1115  ceFEPIme (MAXIPIME) 2 g in sodium chloride 0.9 % 100 mL IVPB        2 g 200 mL/hr over 30 Minutes Intravenous  Once 01/09/22 1109 01/09/22 1156   01/09/22 1115  metroNIDAZOLE (FLAGYL) IVPB 500 mg        500 mg 100 mL/hr over 60 Minutes Intravenous  Once 01/09/22 1109 01/09/22 1224       Assessment/Plan: Terminal ileitis, possible appendicitis   - CT scan shows severely inflamed terminal ileum, the appendix is adjacent to the terminal ileum and is also severely inflamed, adjacent cecum and small bowel loops appear mildly inflamed; findings are favored to represent a nonspecific infectious or inflammatory terminal ileitis including Crohn's disease; no evidence of perforation or well-defined/drainable fluid collection.  Some improvement but WBC still is elevated and now with diarrhea, and some left sided pain without fever    C diff ordered   Needs a CT tomorrow and that is ordered  [facility] to give clears    IV abx    I again discussed the risks of surgery with the patient including bleeding, infection, open surgery, need for SBR, ileocecectomy, R hemicolectomy, drain placement, wound infection, prolonged hospital stay, as well as the risks of general anesthesia - heart attck, stroke, respiratory complications, and death.   LOS: 3 days    Turner Daniels   MD  Total time 30 minutes    01/12/2022

## 2022-01-12 NOTE — Progress Notes (Signed)
CT shows pelvic abscess from probable perforated appendix  less likely IBD   Ask IR to assess for drain  Continue ABX  Await C diff Having BMs so less likely obstructed

## 2022-01-12 NOTE — Progress Notes (Addendum)
Pharmacy Antibiotic Note  Alexis Holmes is a 71 y.o. female admitted on 01/09/2022 with  intra-abdominal infection .  Pharmacy has been consulted for cefepime dosing, now consulted to transition to Zosyn dosing.   Plan: Zosyn 3.375g IV Q8H infused over 4hrs.  Monitor clinical progress, c/s, renal function, CT results, Blood culture results.    Height: '5\' 1"'$  (154.9 cm) Weight: 61.2 kg (135 lb) IBW/kg (Calculated) : 47.8  Temp (24hrs), Avg:98.7 F (37.1 C), Min:98 F (36.7 C), Max:99.7 F (37.6 C)  Recent Labs  Lab 01/09/22 1035 01/09/22 1109 01/09/22 1251 01/09/22 1747 01/10/22 0429 01/11/22 0250 01/11/22 0859 01/12/22 0529  WBC 20.4*  --   --   --  9.6  --  15.0* 16.7*  CREATININE 1.36*  --   --   --  1.08* 1.11*  --  1.08*  LATICACIDVEN  --  1.9 2.8* 2.2*  --   --   --   --      Estimated Creatinine Clearance: 39.5 mL/min (A) (by C-G formula based on SCr of 1.08 mg/dL (H)).    No Known Allergies  Antimicrobials this admission:  5/18 Cefepime >> 5/21 5/18 Metronidazole >> 5/21 5/21 Zosyn >>   Dose adjustments this admission:    Microbiology results:  5/18 BCx: GNR (1/4 bottles, anaerobic bottle only), no BCID results 5/18 Ucx: NGF 5/19 MRSA PCR neg 5/21 Cdiff Ag neg; Toxin neg    Gretta Arab PharmD, BCPS Clinical Pharmacist [facility] main pharmacy [phone] 01/12/2022 11:33 AM

## 2022-01-13 ENCOUNTER — Inpatient Hospital Stay (HOSPITAL_COMMUNITY): Payer: Medicare Other

## 2022-01-13 DIAGNOSIS — K50019 Crohn's disease of small intestine with unspecified complications: Secondary | ICD-10-CM | POA: Diagnosis not present

## 2022-01-13 DIAGNOSIS — A419 Sepsis, unspecified organism: Secondary | ICD-10-CM | POA: Diagnosis not present

## 2022-01-13 DIAGNOSIS — K5000 Crohn's disease of small intestine without complications: Secondary | ICD-10-CM | POA: Diagnosis not present

## 2022-01-13 LAB — CBC
HCT: 30 % — ABNORMAL LOW (ref 36.0–46.0)
Hemoglobin: 10.1 g/dL — ABNORMAL LOW (ref 12.0–15.0)
MCH: 31.5 pg (ref 26.0–34.0)
MCHC: 33.7 g/dL (ref 30.0–36.0)
MCV: 93.5 fL (ref 80.0–100.0)
Platelets: 327 10*3/uL (ref 150–400)
RBC: 3.21 MIL/uL — ABNORMAL LOW (ref 3.87–5.11)
RDW: 13.2 % (ref 11.5–15.5)
WBC: 15 10*3/uL — ABNORMAL HIGH (ref 4.0–10.5)
nRBC: 0 % (ref 0.0–0.2)

## 2022-01-13 LAB — PROTIME-INR
INR: 1.1 (ref 0.8–1.2)
Prothrombin Time: 14.5 seconds (ref 11.4–15.2)

## 2022-01-13 MED ORDER — LACTATED RINGERS IV BOLUS: 1000.0000 mL | Freq: Three times a day (TID) | INTRAVENOUS | Status: DC | PRN

## 2022-01-13 MED ORDER — KCL IN DEXTROSE-NACL 40-5-0.45 MEQ/L-%-% IV SOLN
INTRAVENOUS | Status: DC
  Filled 2022-01-13: qty 1000

## 2022-01-13 MED ORDER — MEPERIDINE HCL 100 MG/ML IJ SOLN
INTRAMUSCULAR | Status: AC
  Administered 2022-01-13: 25 mg
  Filled 2022-01-13: qty 1

## 2022-01-13 MED ORDER — ENOXAPARIN SODIUM 40 MG/0.4ML IJ SOSY
40.0000 mg | PREFILLED_SYRINGE | INTRAMUSCULAR | Status: DC
Start: 2022-01-14 — End: 2022-01-17
  Administered 2022-01-14 – 2022-01-17 (×4): 40 mg via SUBCUTANEOUS
  Filled 2022-01-13 (×4): qty 0.4

## 2022-01-13 MED ORDER — SODIUM CHLORIDE 0.9% FLUSH
3.0000 mL | INTRAVENOUS | Status: DC | PRN
Start: 2022-01-13 — End: 2022-01-17

## 2022-01-13 MED ORDER — SODIUM CHLORIDE 0.9 % IV SOLN
INTRAVENOUS | Status: DC
Start: 2022-01-13 — End: 2022-01-17

## 2022-01-13 MED ORDER — SODIUM CHLORIDE 0.9% FLUSH
5.0000 mL | Freq: Three times a day (TID) | INTRAVENOUS | Status: DC
  Administered 2022-01-14 – 2022-01-17 (×7): 5 mL

## 2022-01-13 MED ORDER — FENTANYL CITRATE (PF) 100 MCG/2ML IJ SOLN
INTRAMUSCULAR | Status: AC | PRN
  Administered 2022-01-13 (×2): 50 ug via INTRAVENOUS

## 2022-01-13 MED ORDER — MEPERIDINE HCL 25 MG/ML IJ SOLN
25.0000 mg | Freq: Once | INTRAMUSCULAR | Status: DC
  Filled 2022-01-13: qty 1

## 2022-01-13 MED ORDER — MIDAZOLAM HCL 2 MG/2ML IJ SOLN
INTRAMUSCULAR | Status: AC
  Filled 2022-01-13: qty 4

## 2022-01-13 MED ORDER — SODIUM CHLORIDE 0.9 % IV SOLN
250.0000 mL | INTRAVENOUS | Status: DC | PRN
Start: 2022-01-13 — End: 2022-01-17

## 2022-01-13 MED ORDER — LIDOCAINE HCL (PF) 1 % IJ SOLN
INTRAMUSCULAR | Status: AC | PRN
  Administered 2022-01-13: 20 mL

## 2022-01-13 MED ORDER — MIDAZOLAM HCL 2 MG/2ML IJ SOLN
INTRAMUSCULAR | Status: AC | PRN
  Administered 2022-01-13 (×2): 1 mg via INTRAVENOUS

## 2022-01-13 MED ORDER — FENTANYL CITRATE (PF) 100 MCG/2ML IJ SOLN
INTRAMUSCULAR | Status: AC
  Filled 2022-01-13: qty 2

## 2022-01-13 MED ORDER — SODIUM CHLORIDE 0.9% FLUSH
3.0000 mL | Freq: Two times a day (BID) | INTRAVENOUS | Status: DC
  Administered 2022-01-13 – 2022-01-17 (×4): 3 mL via INTRAVENOUS

## 2022-01-13 MED ORDER — FUROSEMIDE 10 MG/ML IJ SOLN
40.0000 mg | Freq: Once | INTRAMUSCULAR | Status: AC
Start: 2022-01-13 — End: 2022-01-13
  Administered 2022-01-13: 40 mg via INTRAVENOUS
  Filled 2022-01-13: qty 4

## 2022-01-13 MED ORDER — IBUPROFEN 200 MG PO TABS
600.0000 mg | ORAL_TABLET | ORAL | Status: DC | PRN
  Administered 2022-01-13: 600 mg via ORAL
  Filled 2022-01-13 (×2): qty 3

## 2022-01-13 NOTE — Progress Notes (Signed)
MEDICATION-RELATED CONSULT NOTE   IR Procedure Consult - Anticoagulant/Antiplatelet PTA/Inpatient Med List Review by Pharmacist    Procedure: CT guided drain insertion into right pelvic fluid collection    Completed: 01/13/2022 ~ 1530  Post-Procedural bleeding risk per IR MD assessment: standard  Antithrombotic medications on inpatient or PTA profile prior to procedure:   Inpatient: Enoxaparin '40mg'$  SQ q24h (last dose 01/12/2022 at 2143) PTA: Aspirin '81mg'$  PO daily (not resumed by MD)    Recommended restart time per IR Post-Procedure Guidelines:   Prophylactic dose LMWH: next AM after procedure Aspirin '81mg'$  daily: unlikely that it would be stopped for IR procedure    Plan:     Adjust Enoxaparin '40mg'$  SQ q24h to next dose due 01/14/2022 at 1000. MD to consider resuming low dose aspirin, if clinically appropriate.   Pharmacy signing off.    Lindell Spar, PharmD, BCPS Clinical Pharmacist  01/13/2022 4:29 PM

## 2022-01-13 NOTE — Progress Notes (Signed)
Pt arrived back from IR from getting drain placed in buttocks area.  Pt had intense rigors.  VS placed pt in "yellow mews".  Paged IR and Attending.  Administered Demerol as ordered and started IVF.  Executed new orders.  Will continue to monitor.

## 2022-01-13 NOTE — Progress Notes (Signed)
   01/13/22 1544  Assess: MEWS Score  Temp 99.3 F (37.4 C)  BP 139/89  Pulse Rate (!) 104  SpO2 98 %  O2 Device Nasal Cannula  O2 Flow Rate (L/min) 3 L/min  Assess: MEWS Score  MEWS Temp 0  MEWS Systolic 0  MEWS Pulse 1  MEWS RR 2  MEWS LOC 0  MEWS Score 3  MEWS Score Color Yellow  Assess: if the MEWS score is Yellow or Red  Were vital signs taken at a resting state? Yes  Focused Assessment Change from prior assessment (see assessment flowsheet)  Does the patient meet 2 or more of the SIRS criteria? No  MEWS guidelines implemented *See Row Information* Yes  Treat  MEWS Interventions Administered prn meds/treatments  Pain Scale Faces  Pain Score 6  Pain Type Surgical pain  Pain Location Abdomen  Take Vital Signs  Increase Vital Sign Frequency  Yellow: Q 2hr X 2 then Q 4hr X 2, if remains yellow, continue Q 4hrs  Escalate  MEWS: Escalate Yellow: discuss with charge nurse/RN and consider discussing with provider and RRT  Notify: Charge Nurse/RN  Name of Charge Nurse/RN Notified Tunya Held/Vera  Date Charge Nurse/RN Notified 01/13/22  Time Charge Nurse/RN Notified 1544  Notify: Provider  Provider Name/Title Allred/Feliz  Date Provider Notified 01/13/22  Time Provider Notified 1544  Method of Notification Page  Notification Reason Change in status  Provider response See new orders  Date of Provider Response 01/13/22  Time of Provider Response 1550  Notify: Rapid Response  Name of Rapid Response RN Notified RReponse RN  Date Rapid Response Notified 01/13/22  Time Rapid Response Notified [id number]  Document  Patient Outcome Stabilized after interventions  Assess: SIRS CRITERIA  SIRS Temperature  0  SIRS Pulse 1  SIRS Respirations  1  SIRS WBC 0  SIRS Score Sum  2

## 2022-01-13 NOTE — Procedures (Signed)
  Procedure:  CT guided drain insertion into Rt pelvic fluid collection. A 10 Fr drain was inserted and ~10 ml of foul smelling thin pus  was drfained. Preprocedure diagnosis: Abscess at Rt lower pelvis Postprocedure diagnosis: same EBL:    minimal Complications:   none immediate  See full dictation in [facility].  Frazier Richards, MD Main # [phone] Mobile [phone]

## 2022-01-13 NOTE — Progress Notes (Signed)
[facility] PROGRESS NOTE    Progress Note  Alexis Holmes  VZC:[id number] DOB: 07-17-1951 DOA: 01/09/2022 PCP: Holland Commons, FNP     Brief Narrative:   Alexis Holmes is an 71 y.o. female past medical history of hyperlipidemia and previous nephrolithiasis comes in complaining of abdominal pain that started on Tuesday located in her lower quadrant it has progressively gotten worse to the point where she cannot move.  White blood cell count was 20,000 CT scan showed appendix severely inflamed with the cecum and small bowel.  Hours.   Assessment/Plan:   Sepsis due to undetermined organism due to ileitis, regional [facility]) Transition to IV Zosyn. Sepsis physiology has resolved.   General surgery was consulted recommended a CT of the abdomen pelvis showed multiple abscesses. IR was consulted relates there is no CT window for percutaneous drainage available. Diet per surgery, urine output is increasing, she is is auto diuresing. Continue IV narcotics. Blood cultures remain negative till date.  Further management per general surgery. Currently NPO.   DVT prophylaxis: lovenox Family Communication:sister Status is: Inpatient Remains inpatient appropriate because: Sepsis due to regional enteritis.    Code Status:     Code Status Orders  (From admission, onward)           Start     Ordered   01/09/22 1826  Full code  Continuous        01/09/22 1826           Code Status History     This patient has a current code status but no historical code status.      Advance Directive Documentation    Flowsheet Row Most Recent Value  Type of Advance Directive Healthcare Power of Attorney  [Alexis Holmes]  Pre-existing out of facility DNR order (yellow form or pink MOST form) --  "MOST" Form in Place? --         IV Access:   Peripheral IV   Procedures and diagnostic studies:   CT ABDOMEN PELVIS W CONTRAST  Result Date: 01/12/2022 CLINICAL DATA:   Follow-up terminal ileitis. Left lower quadrant pain. EXAM: CT ABDOMEN AND PELVIS WITH CONTRAST TECHNIQUE: Multidetector CT imaging of the abdomen and pelvis was performed using the standard protocol following bolus administration of intravenous contrast. RADIATION DOSE REDUCTION: This exam was performed according to the departmental dose-optimization program which includes automated exposure control, adjustment of the mA and/or kV according to patient size and/or use of iterative reconstruction technique. CONTRAST:  168m OMNIPAQUE IOHEXOL 300 MG/ML  SOLN COMPARISON:  01/09/2022 FINDINGS: Lower Chest: New small to moderate bilateral pleural effusions and bilateral lower lobe atelectasis. Hepatobiliary: No hepatic masses identified. Contrast noted in the gallbladder, likely from recent CT, however there is no evidence of cholecystitis or biliary ductal dilatation. Pancreas:  No mass or inflammatory changes. Spleen: Within normal limits in size and appearance. Adrenals/Urinary Tract: No masses identified. No evidence of ureteral calculi or hydronephrosis. Foley catheter seen within the bladder. Stomach/Bowel: Moderate dilatation of small bowel loops is now seen with transition point in the right lower quadrant, consistent with small-bowel obstruction. Near the transition point in the right lower quadrant there is a complex multilocular fluid collection with peripheral rim enhancement measuring 6.3 x 2.2 cm on image 78/2. This is highly suspicious for ruptured appendicitis with abscess. Moderate wall thickening of the adjacent terminal ileum is likely reactive in etiology. There is an additional smaller rim enhancing fluid collection seen in the right pelvic cul-de-sac area which measures  3.5 x 2.1 cm on image 80/2, also suspicious for abscess. Small amount of free fluid noted in the perihepatic space in more since pouch. Mild diffuse mesenteric and body wall edema also seen. Vascular/Lymphatic: No pathologically  enlarged lymph nodes. No acute vascular findings. Aortic atherosclerotic calcification incidentally noted. Reproductive:  No mass or other significant abnormality. Other:  None. Musculoskeletal:  No suspicious bone lesions identified. IMPRESSION: Development of distal small bowel obstruction, with transition point in right lower quadrant. 6.3 cm multilocular fluid collection with rim enhancement in right lower quadrant, highly suspicious for ruptured appendicitis with abscess. Smaller 3.5 cm rim enhancing fluid collection in right pelvic cul-de-sac, also suspicious for abscess. Moderate inflammation of terminal ileum, likely reactive in etiology. New small to moderate bilateral pleural effusions, and bilateral lower lobe atelectasis. Aortic Atherosclerosis (ICD10-I70.0). These results will be called to the ordering clinician or representative by the Radiologist Assistant, and communication documented in the PACS or [employer]. Electronically Signed   By: Marlaine Hind M.D.   On: 01/12/2022 10:13     Medical Consultants:   None.   Subjective:    Alexis Holmes relate her pain is controlled with narcotics.  Objective:    Vitals:   01/12/22 0243 01/12/22 0444 01/12/22 1612 01/12/22 2031  BP: 130/66 133/70 129/63 128/61  Pulse: (!) 103 (!) 101 89 94  Resp: '16 18  18  '$ Temp: 98.1 F (36.7 C) 99.7 F (37.6 C) 98.8 F (37.1 C) 99.4 F (37.4 C)  TempSrc: Oral Oral Oral Oral  SpO2: 91% 92% 94% 95%  Weight:      Height:       SpO2: 95 % O2 Flow Rate (L/min): 3 L/min   Intake/Output Summary (Last 24 hours) at 01/13/2022 0904 Last data filed at 01/12/2022 2100 Gross per 24 hour  Intake 975.69 ml  Output 1000 ml  Net -24.31 ml    Filed Weights   01/09/22 1025 01/09/22 1723  Weight: 61.2 kg 61.2 kg    Exam: General exam: In no acute distress. Respiratory system: Good air movement and clear to auscultation. Cardiovascular system: S1 & S2 heard, RRR. No JVD. Gastrointestinal  system: Abdomen is nondistended, soft and nontender.  Extremities: No pedal edema. Skin: No rashes, lesions or ulcers Psychiatry: Judgement and insight appear normal. Mood & affect appropriate.   Data Reviewed:    Labs: Basic Metabolic Panel: Recent Labs  Lab 01/09/22 1035 01/10/22 0429 01/11/22 0249 01/11/22 0250 01/12/22 0529  NA 134* 135  --  140 140  K 3.8 5.1  --  3.9 3.3*  CL 97* 107  --  111 113*  CO2 24 21*  --  19* 18*  GLUCOSE 106* 84  --  72 87  BUN 16 18  --  19 19  CREATININE 1.36* 1.08*  --  1.11* 1.08*  CALCIUM 9.0 7.5*  --  7.2* 7.7*  MG 2.2  --  2.0  --   --   PHOS 3.0  --   --  2.0*  --     GFR Estimated Creatinine Clearance: 39.5 mL/min (A) (by C-G formula based on SCr of 1.08 mg/dL (H)). Liver Function Tests: Recent Labs  Lab 01/09/22 1035  AST 26  ALT 17  ALKPHOS 63  BILITOT 1.3*  PROT 7.5  ALBUMIN 4.1    Recent Labs  Lab 01/09/22 1035  LIPASE 37    No results for input(s): AMMONIA in the last 168 hours. Coagulation profile No results for input(s): INR,  PROTIME in the last 168 hours. COVID-19 Labs  No results for input(s): DDIMER, FERRITIN, LDH, CRP in the last 72 hours.  Lab Results  Component Value Date   [place] NEGATIVE 02/16/2020    CBC: Recent Labs  Lab 01/09/22 1035 01/10/22 0429 01/11/22 0859 01/12/22 0529 01/13/22 0442  WBC 20.4* 9.6 15.0* 16.7* 15.0*  NEUTROABS 17.4*  --   --   --   --   HGB 12.9 10.1* 10.7* 10.6* 10.1*  HCT 38.4 29.6* 33.7* 31.4* 30.0*  MCV 93.2 93.7 99.7 93.7 93.5  PLT 315 210 278 334 327    Cardiac Enzymes: No results for input(s): CKTOTAL, CKMB, CKMBINDEX, TROPONINI in the last 168 hours. BNP (last 3 results) No results for input(s): PROBNP in the last 8760 hours. CBG: No results for input(s): GLUCAP in the last 168 hours. D-Dimer: No results for input(s): DDIMER in the last 72 hours. Hgb A1c: No results for input(s): HGBA1C in the last 72 hours. Lipid Profile: No results  for input(s): CHOL, HDL, LDLCALC, TRIG, CHOLHDL, LDLDIRECT in the last 72 hours. Thyroid function studies: No results for input(s): TSH, T4TOTAL, T3FREE, THYROIDAB in the last 72 hours.  Invalid input(s): FREET3 Anemia work up: No results for input(s): VITAMINB12, FOLATE, FERRITIN, TIBC, IRON, RETICCTPCT in the last 72 hours. Sepsis Labs: Recent Labs  Lab 01/09/22 1109 01/09/22 1251 01/09/22 1747 01/10/22 0429 01/11/22 0859 01/12/22 0529 01/13/22 0442  WBC  --   --   --  9.6 15.0* 16.7* 15.0*  LATICACIDVEN 1.9 2.8* 2.2*  --   --   --   --     Microbiology Recent Results (from the past 240 hour(s))  Urine Culture     Status: None   Collection Time: 01/09/22 11:09 AM   Specimen: Urine, Clean Catch  Result Value Ref Range Status   Specimen Description   Final    URINE, CLEAN CATCH Performed at [facility], [address] [place], [place] [address]    Special Requests   Final    NONE Performed at [facility], [address] [place], [place] [address]    Culture   Final    NO GROWTH Performed at [facility], [place] [address], [place], [place] [address]    Report Status 01/10/2022 FINAL  Final  Blood culture (routine x 2)     Status: None (Preliminary result)   Collection Time: 01/09/22 11:09 AM   Specimen: Left Antecubital; Blood  Result Value Ref Range Status   Specimen Description   Final    LEFT ANTECUBITAL Performed at [facility], [address] [place], [place] [address]    Special Requests   Final    BOTTLES DRAWN AEROBIC AND ANAEROBIC Blood Culture adequate volume Performed at [facility], [address] [place], [place] [address]    Culture  Setup Time   Final    GRAM NEGATIVE RODS ANAEROBIC BOTTLE ONLY CRITICAL RESULT CALLED TO, READ BACK BY AND VERIFIED WITH: PHARMD ELLEN JACKSON 01/12/22'@2'$ :36 BY TW Performed at [facility], [place] [address], [place], [place] [address]    Culture GRAM  NEGATIVE RODS  Final   Report Status PENDING  Incomplete  Blood culture (routine x 2)     Status: None (Preliminary result)   Collection Time: 01/09/22 11:09 AM   Specimen: Right Antecubital; Blood  Result Value Ref Range Status   Specimen Description   Final  RIGHT ANTECUBITAL Performed at [facility], [address] [place], [place] [address]    Special Requests   Final    BOTTLES DRAWN AEROBIC AND ANAEROBIC Blood Culture adequate volume Performed at [facility], [address] [place], [place] [address]    Culture   Final    NO GROWTH 4 DAYS Performed at [facility], [place] [address], [place], [place] [address]    Report Status PENDING  Incomplete  Blood Culture ID Panel (Reflexed)     Status: None   Collection Time: 01/09/22 11:09 AM  Result Value Ref Range Status   Enterococcus faecalis NOT DETECTED NOT DETECTED Final   Enterococcus Faecium NOT DETECTED NOT DETECTED Final   Listeria monocytogenes NOT DETECTED NOT DETECTED Final   Staphylococcus species NOT DETECTED NOT DETECTED Final   Staphylococcus aureus (BCID) NOT DETECTED NOT DETECTED Final   Staphylococcus epidermidis NOT DETECTED NOT DETECTED Final   Staphylococcus lugdunensis NOT DETECTED NOT DETECTED Final   Streptococcus species NOT DETECTED NOT DETECTED Final   Streptococcus agalactiae NOT DETECTED NOT DETECTED Final   Streptococcus pneumoniae NOT DETECTED NOT DETECTED Final   Streptococcus pyogenes NOT DETECTED NOT DETECTED Final   A.calcoaceticus-baumannii NOT DETECTED NOT DETECTED Final   Bacteroides fragilis NOT DETECTED NOT DETECTED Final   Enterobacterales NOT DETECTED NOT DETECTED Final   Enterobacter cloacae complex NOT DETECTED NOT DETECTED Final   Escherichia coli NOT DETECTED NOT DETECTED Final   Klebsiella aerogenes NOT DETECTED NOT DETECTED Final   Klebsiella oxytoca NOT DETECTED NOT DETECTED Final   Klebsiella pneumoniae NOT DETECTED NOT DETECTED Final   Proteus  species NOT DETECTED NOT DETECTED Final   Salmonella species NOT DETECTED NOT DETECTED Final   Serratia marcescens NOT DETECTED NOT DETECTED Final   Haemophilus influenzae NOT DETECTED NOT DETECTED Final   Neisseria meningitidis NOT DETECTED NOT DETECTED Final   Pseudomonas aeruginosa NOT DETECTED NOT DETECTED Final   Stenotrophomonas maltophilia NOT DETECTED NOT DETECTED Final   Candida albicans NOT DETECTED NOT DETECTED Final   Candida auris NOT DETECTED NOT DETECTED Final   Candida glabrata NOT DETECTED NOT DETECTED Final   Candida krusei NOT DETECTED NOT DETECTED Final   Candida parapsilosis NOT DETECTED NOT DETECTED Final   Candida tropicalis NOT DETECTED NOT DETECTED Final   Cryptococcus neoformans/gattii NOT DETECTED NOT DETECTED Final    Comment: Performed at [facility], [place] [address] [place], [place] [address]  MRSA Next Gen by PCR, Nasal     Status: None   Collection Time: 01/10/22  5:43 AM   Specimen: Nasal Mucosa; Nasal Swab  Result Value Ref Range Status   MRSA by PCR Next Gen NOT DETECTED NOT DETECTED Final    Comment: (NOTE) The GeneXpert MRSA Assay (FDA approved for NASAL specimens only), is one component of a comprehensive MRSA colonization surveillance program. It is not intended to diagnose MRSA infection nor to guide or monitor treatment for MRSA infections. Test performance is not FDA approved in patients less than 39 years old. Performed at [facility], [place] [address], [place], [place] [address]   C Difficile Quick Screen (NO PCR Reflex)     Status: None   Collection Time: 01/12/22  8:37 AM   Specimen: STOOL  Result Value Ref Range Status   C Diff antigen NEGATIVE NEGATIVE Final   C Diff toxin NEGATIVE NEGATIVE Final   C Diff interpretation No C. difficile detected.  Final  Comment: Performed at [facility], [place] [address], [place], [place] [address]     Medications:    Chlorhexidine Gluconate Cloth  6  each Topical Daily   enoxaparin (LOVENOX) injection  40 mg Subcutaneous Q24H   furosemide  40 mg Intravenous Once   lip balm  1 application. Topical BID   mouth rinse  15 mL Mouth Rinse BID   sodium chloride flush  3 mL Intravenous Q12H   Continuous Infusions:  sodium chloride     lactated ringers     methocarbamol (ROBAXIN) IV     ondansetron (ZOFRAN) IV Stopped (01/11/22 0243)   piperacillin-tazobactam (ZOSYN)  IV 3.375 g (01/12/22 2342)      LOS: 4 days   Charlynne Cousins  [facility]  01/13/2022, 9:04 AM

## 2022-01-13 NOTE — Procedures (Deleted)
.  brie

## 2022-01-13 NOTE — Consult Note (Signed)
Chief Complaint: Patient was seen in consultation today for CT guided aspiration possible drainage of pelvic fluid collection Chief Complaint  Patient presents with   Abdominal Pain     Referring Physician(s): Gross,S  Supervising Physician: Juliet Rude  Patient Status: [facility] - In-pt  History of Present Illness: Alexis Holmes is a 71 y.o. female with past medical history of arthritis, colon polyps, hypercholesterolemia, nephrolithiasis ,vitamin D deficiency who was admitted to [facility] on 01/09/2022 with worsening lower abdominal pain and diminished appetite.  In the ED patient was noted to be febrile with leukocytosis of 20,000 lactic acid of 2.8 and CT revealing severely inflamed appendix/cecum/small bowel likely secondary to adjacent acute appendicitis.  Latest CT A/P performed on 5/21 revealed:   Development of distal small bowel obstruction, with transition point in right lower quadrant.   6.3 cm multilocular fluid collection with rim enhancement in right lower quadrant, highly suspicious for ruptured appendicitis with abscess.   Smaller 3.5 cm rim enhancing fluid collection in right pelvic cul-de-sac, also suspicious for abscess.   Moderate inflammation of terminal ileum, likely reactive in etiology.   New small to moderate bilateral pleural effusions, and bilateral lower lobe atelectasis.   Aortic Atherosclerosis  Latest temp 99.4, BP okay, WBC 15, hemoglobin 10.1, platelets normal, creatinine 1.08, potassium 3.3, PT/INR pending  Request was received yesterday for possible image guided drainage of abdominal/pelvic fluid collections; images were reviewed by Dr. Earleen Newport; The only accessible component of the multi-loculated abscess is dependent pelvis. Possible transgluteal, however is the minority of the abscess. The majority of abscess is loculated along the right pelvic sidewall where there is no CT window for perc drainage. Discussed with Dr. Brantley Stage.   Request again received this morning from Dr. Johney Maine regarding need for aspiration/drain placement of pelvic fluid collection.  Images were reviewed again by Dr. Denna Haggard and case discussed again with Dr. Johney Maine.  We will attempt to bring patient down today for aspiration and possible drain placement.   Past Medical History:  Diagnosis Date   Arthritis    Colon polyps    Hypercholesterolemia    Intention tremor    Right   Menopause    Osteopenia    Vitamin D deficiency     Past Surgical History:  Procedure Laterality Date   EXTRACORPOREAL SHOCK WAVE LITHOTRIPSY Left 02/20/2020   Procedure: EXTRACORPOREAL SHOCK WAVE LITHOTRIPSY (ESWL);  Surgeon: Raynelle Bring, MD;  Location: [facility];  Service: Urology;  Laterality: Left;   LEG SURGERY  1988-1990   s/p fracture    Allergies: Patient has no known allergies.  Medications: Prior to Admission medications   Medication Sig Start Date End Date Taking? Authorizing Provider  alendronate (FOSAMAX) 70 MG tablet Take 70 mg by mouth once a week. 02/26/21  Yes [provider]  aspirin 81 MG chewable tablet Chew 81 mg by mouth daily.   Yes [provider]  B Complex Vitamins (B COMPLEX 100 PO) Take 1 tablet by mouth daily.   Yes [provider]  Biotin 1 MG CAPS Take 1 mg by mouth daily.   Yes [provider]  Calcium-Phosphorus-Vitamin D (CITRACAL +D3 PO) Take 1 tablet by mouth daily.   Yes [provider]  Cholecalciferol (VITAMIN D) 125 MCG (5000 UT) CAPS Take 5,000 Units by mouth daily.   Yes [provider]  DULoxetine (CYMBALTA) 20 MG capsule Take 20 mg by mouth daily.   Yes [provider]  ibuprofen (ADVIL)  200 MG tablet Take 200-400 mg by mouth every 6 (six) hours as needed for moderate pain.   Yes [provider]  Lecithin 1200 MG CAPS Take 1,200 mg by mouth daily.   Yes [provider]  Lutein 40 MG CAPS Take 40 mg by mouth daily.   Yes  [provider]  magnesium 30 MG tablet Take 30 mg by mouth daily.   Yes [provider]  magnesium hydroxide (MILK OF MAGNESIA) 400 MG/5ML suspension Take 30 mLs by mouth daily as needed for mild constipation.   Yes [provider]  Omega-3 Fatty Acids (FISH OIL) 1000 MG CAPS Take 1,000 mg by mouth daily.   Yes [provider]  psyllium (METAMUCIL) 58.6 % powder Take 1 packet by mouth daily as needed (constipation).   Yes [provider]  Turmeric 500 MG CAPS Take 500 mg by mouth daily.   Yes [provider]  vitamin B-12 (CYANOCOBALAMIN) 1000 MCG tablet Take 1,000 mcg by mouth daily.   Yes [provider]  vitamin C (ASCORBIC ACID) 500 MG tablet Take 500 mg by mouth daily.   Yes [provider]  cyclobenzaprine (FLEXERIL) 5 MG tablet Take 1 tablet (5 mg total) by mouth at bedtime. Patient not taking: Reported on 01/09/2022 12/02/21   Raylene Everts, MD  naproxen sodium (ANAPROX DS) 550 MG tablet Take 1 tablet (550 mg total) by mouth 2 (two) times daily with a meal. Patient not taking: Reported on 01/09/2022 12/02/21   Raylene Everts, MD     Family History  Problem Relation Age of Onset   Fibromyalgia Brother    Diabetes Maternal Grandfather    Breast cancer Maternal Aunt     Social History   Socioeconomic History   Marital status: Divorced    Spouse name: Not on file   Number of children: 0   Years of education: Assoc deg   Highest education level: Not on file  Occupational History   Occupation: [employer]    Employer: [employer]  Tobacco Use   Smoking status: Never   Smokeless tobacco: Never  Substance and Sexual Activity   Alcohol use: No   Drug use: No   Sexual activity: Yes    Partners: Female  Other Topics Concern   Not on file  Social History Narrative   Not on file   Social Determinants of Health   Financial Resource Strain: Not on file  Food Insecurity: Not on file   Transportation Needs: Not on file  Physical Activity: Not on file  Stress: Not on file  Social Connections: Not on file      Review of Systems currently denies fever, headache, chest pain, back pain, nausea, vomiting or bleeding. She does have occasional cough, dyspnea, and lower abdominal discomfort; has also had some loose stools.  Vital Signs: BP 128/61 (BP Location: Right Arm)   Pulse 94   Temp 99.4 F (37.4 C) (Oral)   Resp 18   Ht '5\' 1"'$  (1.549 m)   Wt 135 lb (61.2 kg)   SpO2 95%   BMI 25.51 kg/m   Physical Exam awake, alert.  Chest with diminished breath sounds at bases.  Heart with regular rate and rhythm.  Abdomen soft, positive bowel sounds, some mildly tender anterior lower abdominal discomfort to palpation; some mild pretibial edema bilaterally  Imaging: DG Abdomen 1 View  Result Date: 01/09/2022 CLINICAL DATA:  Lower abdominal pain, possible bowel impaction EXAM: ABDOMEN - 1  VIEW COMPARISON:  Prior abdominal radiograph 03/12/2020 FINDINGS: No large free air. No focal bowel dilation to suggest obstruction. The colonic stool burden is low. No evidence of fecal impaction. Gas noted within the sigmoid colon. Lung bases are clear. No abnormal calcifications. IMPRESSION: 1. No evidence of obstruction. 2. Low/normal colonic stool burden.  No evidence of fecal impaction. Electronically Signed   By: Jacqulynn Cadet M.D.   On: 01/09/2022 08:52   CT ABDOMEN PELVIS W CONTRAST  Result Date: 01/12/2022 CLINICAL DATA:  Follow-up terminal ileitis. Left lower quadrant pain. EXAM: CT ABDOMEN AND PELVIS WITH CONTRAST TECHNIQUE: Multidetector CT imaging of the abdomen and pelvis was performed using the standard protocol following bolus administration of intravenous contrast. RADIATION DOSE REDUCTION: This exam was performed according to the departmental dose-optimization program which includes automated exposure control, adjustment of the mA and/or kV according to patient size and/or use  of iterative reconstruction technique. CONTRAST:  132m OMNIPAQUE IOHEXOL 300 MG/ML  SOLN COMPARISON:  01/09/2022 FINDINGS: Lower Chest: New small to moderate bilateral pleural effusions and bilateral lower lobe atelectasis. Hepatobiliary: No hepatic masses identified. Contrast noted in the gallbladder, likely from recent CT, however there is no evidence of cholecystitis or biliary ductal dilatation. Pancreas:  No mass or inflammatory changes. Spleen: Within normal limits in size and appearance. Adrenals/Urinary Tract: No masses identified. No evidence of ureteral calculi or hydronephrosis. Foley catheter seen within the bladder. Stomach/Bowel: Moderate dilatation of small bowel loops is now seen with transition point in the right lower quadrant, consistent with small-bowel obstruction. Near the transition point in the right lower quadrant there is a complex multilocular fluid collection with peripheral rim enhancement measuring 6.3 x 2.2 cm on image 78/2. This is highly suspicious for ruptured appendicitis with abscess. Moderate wall thickening of the adjacent terminal ileum is likely reactive in etiology. There is an additional smaller rim enhancing fluid collection seen in the right pelvic cul-de-sac area which measures 3.5 x 2.1 cm on image 80/2, also suspicious for abscess. Small amount of free fluid noted in the perihepatic space in more since pouch. Mild diffuse mesenteric and body wall edema also seen. Vascular/Lymphatic: No pathologically enlarged lymph nodes. No acute vascular findings. Aortic atherosclerotic calcification incidentally noted. Reproductive:  No mass or other significant abnormality. Other:  None. Musculoskeletal:  No suspicious bone lesions identified. IMPRESSION: Development of distal small bowel obstruction, with transition point in right lower quadrant. 6.3 cm multilocular fluid collection with rim enhancement in right lower quadrant, highly suspicious for ruptured appendicitis with  abscess. Smaller 3.5 cm rim enhancing fluid collection in right pelvic cul-de-sac, also suspicious for abscess. Moderate inflammation of terminal ileum, likely reactive in etiology. New small to moderate bilateral pleural effusions, and bilateral lower lobe atelectasis. Aortic Atherosclerosis (ICD10-I70.0). These results will be called to the ordering clinician or representative by the Radiologist Assistant, and communication documented in the PACS or [employer] Electronically Signed   By: JMarlaine HindM.D.   On: 01/12/2022 10:13   CT ABDOMEN PELVIS W CONTRAST  Result Date: 01/09/2022 CLINICAL DATA:  Abdominal pain, acute, nonlocalized EXAM: CT ABDOMEN AND PELVIS WITH CONTRAST TECHNIQUE: Multidetector CT imaging of the abdomen and pelvis was performed using the standard protocol following bolus administration of intravenous contrast. RADIATION DOSE REDUCTION: This exam was performed according to the departmental dose-optimization program which includes automated exposure control, adjustment of the mA and/or kV according to patient size and/or use of iterative reconstruction technique. CONTRAST:  1076mOMNIPAQUE IOHEXOL 300 MG/ML  SOLN  COMPARISON:  CT 02/13/2020, 11/27/2016 FINDINGS: Lower chest: Included lung bases are clear.  Heart size is normal. Hepatobiliary: No focal liver abnormality is seen. No gallstones, gallbladder wall thickening, or biliary dilatation. Pancreas: Unremarkable. No pancreatic ductal dilatation or surrounding inflammatory changes. Spleen: Normal in size without focal abnormality. Adrenals/Urinary Tract: Unremarkable adrenal glands. Small right renal cysts, stable from 2018 and benign. Kidneys enhance symmetrically. No renal stone or hydronephrosis. Urinary bladder is incompletely distended. Stomach/Bowel: Markedly abnormal appearance of the terminal ileum with severe wall thickening, mucosal hyperenhancement, and submucosal edema (series 5, images 48-51). The appendix is seen  adjacent to the terminal ileum and also appears inflamed (series 5, images 34-46). Adjacent cecum and small bowel loops also appear mildly inflamed. No dilated loops of bowel to suggest obstruction. Stomach within normal limits. Vascular/Lymphatic: Scattered aortoiliac atherosclerotic calcifications without aneurysm. No abdominopelvic lymphadenopathy. Reproductive: Inflammatory changes within the right adnexal region. Right ovary is difficult to discern. Uterus and left adnexa are unremarkable. Other: No pneumoperitoneum.  No abdominal wall hernia. Musculoskeletal: No acute or significant osseous findings. IMPRESSION: 1. Severely inflamed terminal ileum. The appendix is seen adjacent to the terminal ileum and is also severely inflamed. Adjacent cecum and small bowel loops also appear mildly inflamed. Findings are favored to represent a nonspecific infectious or inflammatory terminal ileitis including Crohn's disease. A reactive terminal ileitis secondary to adjacent acute appendicitis is also a consideration. No evidence of perforation or well-defined/drainable fluid collection. Surgical and/or GI consultation is recommended. 2. Inflammatory changes within the right adnexal region. Right ovary is difficult to discern. The possibility of an entero-ovarian fistulized not excluded. Aortic Atherosclerosis (ICD10-I70.0). Electronically Signed   By: Davina Poke D.O.   On: 01/09/2022 11:40   DG CHEST PORT 1 VIEW  Result Date: 01/11/2022 CLINICAL DATA:  Hypoxia EXAM: PORTABLE CHEST 1 VIEW COMPARISON:  01/09/2022 FINDINGS: The lung volumes are small, but are stable since prior examination. Bibasilar opacification likely relates to the presence of small bilateral pleural effusions, new since prior examination. Associated bibasilar atelectasis. No pneumothorax. Cardiac size within normal limits. Pulmonary vascularity is normal. No acute bone abnormality. IMPRESSION: Stable pulmonary hypoinflation. Interval development  of probable small bilateral pleural effusions. Electronically Signed   By: Fidela Salisbury M.D.   On: 01/11/2022 03:14   DG CHEST PORT 1 VIEW  Result Date: 01/09/2022 CLINICAL DATA:  Hypoxia EXAM: PORTABLE CHEST 1 VIEW COMPARISON:  CT from 02/01/2018 FINDINGS: Cardiac shadow is within normal limits. The lungs are well aerated bilaterally. Patchy atelectatic changes are noted bilaterally. No sizable effusion is noted. No bony abnormality is seen. IMPRESSION: Bibasilar atelectasis. Electronically Signed   By: Inez Catalina M.D.   On: 01/09/2022 22:43    Labs:  CBC: Recent Labs    01/10/22 0429 01/11/22 0859 01/12/22 0529 01/13/22 0442  WBC 9.6 15.0* 16.7* 15.0*  HGB 10.1* 10.7* 10.6* 10.1*  HCT 29.6* 33.7* 31.4* 30.0*  PLT 210 278 334 327    COAGS: No results for input(s): INR, APTT in the last 8760 hours.  BMP: Recent Labs    01/09/22 1035 01/10/22 0429 01/11/22 0250 01/12/22 0529  NA 134* 135 140 140  K 3.8 5.1 3.9 3.3*  CL 97* 107 111 113*  CO2 24 21* 19* 18*  GLUCOSE 106* 84 72 87  BUN '16 18 19 19  '$ CALCIUM 9.0 7.5* 7.2* 7.7*  CREATININE 1.36* 1.08* 1.11* 1.08*  GFRNONAA 41* 55* 53* 55*    LIVER FUNCTION TESTS: Recent Labs    01/09/22  1035  BILITOT 1.3*  AST 26  ALT 17  ALKPHOS 63  PROT 7.5  ALBUMIN 4.1    TUMOR MARKERS: No results for input(s): AFPTM, CEA, CA199, CHROMGRNA in the last 8760 hours.  Assessment and Plan: 71 y.o. female with past medical history of arthritis, colon polyps, hypercholesterolemia, nephrolithiasis ,vitamin D deficiency who was admitted to [facility] on 01/09/2022 with worsening lower abdominal pain and diminished appetite.  In the ED patient was noted to be febrile with leukocytosis of 20,000 lactic acid of 2.8 and CT revealing severely inflamed appendix/cecum/small bowel likely secondary to adjacent acute appendicitis.  Latest CT A/P performed on 5/21 revealed:   Development of distal small bowel obstruction, with  transition point in right lower quadrant.   6.3 cm multilocular fluid collection with rim enhancement in right lower quadrant, highly suspicious for ruptured appendicitis with abscess.   Smaller 3.5 cm rim enhancing fluid collection in right pelvic cul-de-sac, also suspicious for abscess.   Moderate inflammation of terminal ileum, likely reactive in etiology.   New small to moderate bilateral pleural effusions, and bilateral lower lobe atelectasis.   Aortic Atherosclerosis  Latest temp 99.4, BP okay, WBC 15, hemoglobin 10.1, platelets normal, creatinine 1.08, potassium 3.3, PT/INR pending  Request was received yesterday for possible image guided drainage of abdominal/pelvic fluid collections; images were reviewed by Dr. Earleen Newport; The only accessible component of the multi-loculated abscess is dependent pelvis. Possible transgluteal, however is the minority of the abscess. The majority of abscess is loculated along the right pelvic sidewall where there is no CT window for perc drainage. Discussed with Dr. Brantley Stage.  Request again received this morning from Dr. Johney Maine regarding need for aspiration/drain placement of pelvic fluid collection.  Images were reviewed again by Dr. Denna Haggard and case discussed again with Dr. Johney Maine.  We will attempt to bring patient down today for aspiration and possible drain placement.Risks and benefits discussed with the patient /caregiver/POA including bleeding, infection, damage to adjacent structures, bowel perforation/fistula connection, and sepsis.  All of the patient's questions were answered, patient is agreeable to proceed. Consent signed and in chart.    Thank you for this interesting consult.  I greatly enjoyed meeting Alexis Holmes and look forward to participating in their care.  A copy of this report was sent to the requesting provider on this date.  Electronically Signed: D. Rowe Robert, PA-C 01/13/2022, 10:29 AM   I spent a total of 25 minutes  in  face to face in clinical consultation, greater than 50% of which was counseling/coordinating care for CT-guided aspiration/possible drainage of pelvic fluid collection

## 2022-01-13 NOTE — Progress Notes (Signed)
Subjective/Chief Complaint: Still with some diarrhea, but no nausea or vomiting despite abdominal distention.  Pain seems stable.    Objective: Vital signs in last 24 hours: Temp:  [98.8 F (37.1 C)-99.4 F (37.4 C)] 99.4 F (37.4 C) (05/21 2031) Pulse Rate:  [89-94] 94 (05/21 2031) Resp:  [18] 18 (05/21 2031) BP: (128-129)/(61-63) 128/61 (05/21 2031) SpO2:  [94 %-95 %] 95 % (05/21 2031) Last BM Date : 01/12/22  Intake/Output from previous day: 05/21 0701 - 05/22 0700 In: 1095.7 [P.O.:480; I.V.:210; IV Piggyback:405.7] Out: 1000 [Urine:1000] Intake/Output this shift: No intake/output data recorded.  General appearance: alert and cooperative Resp: effort slightly labored and on 2L O2 GI: RLQ tenderness but no rebound,  soft, distention noted  Ext: soft, calves nontender, no overt edema in all extremities  Lab Results:  Recent Labs    01/12/22 0529 01/13/22 0442  WBC 16.7* 15.0*  HGB 10.6* 10.1*  HCT 31.4* 30.0*  PLT 334 327   BMET Recent Labs    01/11/22 0250 01/12/22 0529  NA 140 140  K 3.9 3.3*  CL 111 113*  CO2 19* 18*  GLUCOSE 72 87  BUN 19 19  CREATININE 1.11* 1.08*  CALCIUM 7.2* 7.7*   PT/INR No results for input(s): LABPROT, INR in the last 72 hours. ABG No results for input(s): PHART, HCO3 in the last 72 hours.  Invalid input(s): PCO2, PO2  Studies/Results: CT ABDOMEN PELVIS W CONTRAST  Result Date: 01/12/2022 CLINICAL DATA:  Follow-up terminal ileitis. Left lower quadrant pain. EXAM: CT ABDOMEN AND PELVIS WITH CONTRAST TECHNIQUE: Multidetector CT imaging of the abdomen and pelvis was performed using the standard protocol following bolus administration of intravenous contrast. RADIATION DOSE REDUCTION: This exam was performed according to the departmental dose-optimization program which includes automated exposure control, adjustment of the mA and/or kV according to patient size and/or use of iterative reconstruction technique. CONTRAST:   167m OMNIPAQUE IOHEXOL 300 MG/ML  SOLN COMPARISON:  01/09/2022 FINDINGS: Lower Chest: New small to moderate bilateral pleural effusions and bilateral lower lobe atelectasis. Hepatobiliary: No hepatic masses identified. Contrast noted in the gallbladder, likely from recent CT, however there is no evidence of cholecystitis or biliary ductal dilatation. Pancreas:  No mass or inflammatory changes. Spleen: Within normal limits in size and appearance. Adrenals/Urinary Tract: No masses identified. No evidence of ureteral calculi or hydronephrosis. Foley catheter seen within the bladder. Stomach/Bowel: Moderate dilatation of small bowel loops is now seen with transition point in the right lower quadrant, consistent with small-bowel obstruction. Near the transition point in the right lower quadrant there is a complex multilocular fluid collection with peripheral rim enhancement measuring 6.3 x 2.2 cm on image 78/2. This is highly suspicious for ruptured appendicitis with abscess. Moderate wall thickening of the adjacent terminal ileum is likely reactive in etiology. There is an additional smaller rim enhancing fluid collection seen in the right pelvic cul-de-sac area which measures 3.5 x 2.1 cm on image 80/2, also suspicious for abscess. Small amount of free fluid noted in the perihepatic space in more since pouch. Mild diffuse mesenteric and body wall edema also seen. Vascular/Lymphatic: No pathologically enlarged lymph nodes. No acute vascular findings. Aortic atherosclerotic calcification incidentally noted. Reproductive:  No mass or other significant abnormality. Other:  None. Musculoskeletal:  No suspicious bone lesions identified. IMPRESSION: Development of distal small bowel obstruction, with transition point in right lower quadrant. 6.3 cm multilocular fluid collection with rim enhancement in right lower quadrant, highly suspicious for ruptured appendicitis with abscess.  Smaller 3.5 cm rim enhancing fluid collection  in right pelvic cul-de-sac, also suspicious for abscess. Moderate inflammation of terminal ileum, likely reactive in etiology. New small to moderate bilateral pleural effusions, and bilateral lower lobe atelectasis. Aortic Atherosclerosis (ICD10-I70.0). These results will be called to the ordering clinician or representative by the Radiologist Assistant, and communication documented in the PACS or [employer]. Electronically Signed   By: Marlaine Hind M.D.   On: 01/12/2022 10:13    Anti-infectives: Anti-infectives (From admission, onward)    Start     Dose/Rate Route Frequency Ordered Stop   01/12/22 1600  piperacillin-tazobactam (ZOSYN) IVPB 3.375 g        3.375 g 12.5 mL/hr over 240 Minutes Intravenous Every 8 hours 01/12/22 1313     01/09/22 2330  ceFEPIme (MAXIPIME) 2 g in sodium chloride 0.9 % 100 mL IVPB  Status:  Discontinued        2 g 200 mL/hr over 30 Minutes Intravenous Every 12 hours 01/09/22 1133 01/12/22 1246   01/09/22 2200  metroNIDAZOLE (FLAGYL) IVPB 500 mg  Status:  Discontinued        500 mg 100 mL/hr over 60 Minutes Intravenous Every 12 hours 01/09/22 1402 01/12/22 1246   01/09/22 1915  metroNIDAZOLE (FLAGYL) IVPB 500 mg  Status:  Discontinued        500 mg 100 mL/hr over 60 Minutes Intravenous Every 8 hours 01/09/22 1826 01/09/22 1828   01/09/22 1115  ceFEPIme (MAXIPIME) 2 g in sodium chloride 0.9 % 100 mL IVPB        2 g 200 mL/hr over 30 Minutes Intravenous  Once 01/09/22 1109 01/09/22 1156   01/09/22 1115  metroNIDAZOLE (FLAGYL) IVPB 500 mg        500 mg 100 mL/hr over 60 Minutes Intravenous  Once 01/09/22 1109 01/09/22 1224       Assessment/Plan: Terminal ileitis, possible appendicitis vs crohn's disease (IBD)  - CT scan shows severely inflamed terminal ileum, the appendix is adjacent to the terminal ileum and is also severely inflamed, adjacent cecum and small bowel loops appear mildly inflamed; findings are favored to represent a nonspecific infectious or  inflammatory terminal ileitis including Crohn's disease; no evidence of perforation or well-defined/drainable fluid collection.  -patient overall stable.  CT scan from yesterday with 2 abscesses.  IR unsure about whether these are drainable.  Dr. Johney Maine has asked for them to relook today as if we can try to gain control of her infection with drains it may limit her need for a big operation that includes ileocecectomy, R colectomy, possible ileostomy, and other surgical complications.  If we can get control with a drain and IV abx therapy, then we can try to attempt c-scope at 6 weeks to determine if this crohn's or the appendix and proceed as needed at that time with less risk. -follow up WBC -mobilize -if not IR procedure, may have CLD,.  Would not advance as patient with dilated bowels clinically and on CT scan.  FEN - NPO, IVFs VTE - Lovenox ID - zosyn  Shortness of breath - per medicine.  Currently on O2. Seems to have some increased WOB.  Last x-ray 2 days ago.  Seems euvolemic on exam.  Calves soft and nontender.  LOS: 4 days   Henreitta Cea, PA-C See Amion for pager details   01/13/2022

## 2022-01-13 NOTE — Progress Notes (Addendum)
Patient ID: Alexis Holmes, female   DOB: [birth year], 71 y.o.   MRN: [id number] Pt s/p drainage of pelvic fluid collection this afternoon with subsequent episode of bacteremia, incl rigors, tachycardia, tachypnea. Latest temp 98.3. Dr. Olevia Bowens notified. Pt given IV demerol with resolution of rigors, improvement in vitals.  IVF bolus given and pt currently receiving IV Zosyn as well. Remains on 3 liters O2 via n/c. Portable CXR ordered as pt has known bilat effusions. Ibuprofen given . Dr. Olevia Bowens in to see pt. Will cont to monitor. Check drain fluid cx/sensitivities.[facility] team updated.

## 2022-01-14 DIAGNOSIS — K50019 Crohn's disease of small intestine with unspecified complications: Secondary | ICD-10-CM | POA: Diagnosis not present

## 2022-01-14 DIAGNOSIS — A419 Sepsis, unspecified organism: Secondary | ICD-10-CM | POA: Diagnosis not present

## 2022-01-14 DIAGNOSIS — K5000 Crohn's disease of small intestine without complications: Secondary | ICD-10-CM | POA: Diagnosis not present

## 2022-01-14 LAB — CULTURE, BLOOD (ROUTINE X 2)
Culture: NO GROWTH
Special Requests: ADEQUATE
Special Requests: ADEQUATE

## 2022-01-14 LAB — CBC
HCT: 29.3 % — ABNORMAL LOW (ref 36.0–46.0)
Hemoglobin: 9.9 g/dL — ABNORMAL LOW (ref 12.0–15.0)
MCH: 31.8 pg (ref 26.0–34.0)
MCHC: 33.8 g/dL (ref 30.0–36.0)
MCV: 94.2 fL (ref 80.0–100.0)
Platelets: 281 10*3/uL (ref 150–400)
RBC: 3.11 MIL/uL — ABNORMAL LOW (ref 3.87–5.11)
RDW: 13.5 % (ref 11.5–15.5)
WBC: 17.5 10*3/uL — ABNORMAL HIGH (ref 4.0–10.5)
nRBC: 0 % (ref 0.0–0.2)

## 2022-01-14 LAB — BASIC METABOLIC PANEL
Anion gap: 9 (ref 5–15)
BUN: 18 mg/dL (ref 8–23)
CO2: 22 mmol/L (ref 22–32)
Calcium: 7.5 mg/dL — ABNORMAL LOW (ref 8.9–10.3)
Chloride: 108 mmol/L (ref 98–111)
Creatinine, Ser: 1.08 mg/dL — ABNORMAL HIGH (ref 0.44–1.00)
GFR, Estimated: 55 mL/min — ABNORMAL LOW (ref >60)
Glucose, Bld: 97 mg/dL (ref 70–99)
Potassium: 3.5 mmol/L (ref 3.5–5.1)
Sodium: 139 mmol/L (ref 135–145)

## 2022-01-14 MED ORDER — FUROSEMIDE 10 MG/ML IJ SOLN
40.0000 mg | Freq: Once | INTRAMUSCULAR | Status: AC
  Administered 2022-01-14: 40 mg via INTRAVENOUS
  Filled 2022-01-14: qty 4

## 2022-01-14 MED ORDER — FUROSEMIDE 10 MG/ML IJ SOLN
40.0000 mg | Freq: Two times a day (BID) | INTRAMUSCULAR | Status: AC
  Administered 2022-01-14 – 2022-01-15 (×2): 40 mg via INTRAVENOUS
  Filled 2022-01-14 (×2): qty 4

## 2022-01-14 NOTE — Progress Notes (Signed)
Pharmacy Antibiotic Note  Alexis Holmes is a 71 y.o. female admitted on 01/09/2022 with  intra-abdominal infection . Patient is s/p drain placement to pelvic fluid collection. Pharmacy has been consulted for Zosyn dosing.   Plan: Zosyn 3.375g IV Q8H infused over 4hrs.  Pharmacy to sign off, but will continue to monitor renal function, culture results and clinical progress for antibiotic dose adjustments and de-escalation as clinically indicated.   Height: '5\' 1"'$  (154.9 cm) Weight: 61.2 kg (135 lb) IBW/kg (Calculated) : 47.8  Temp (24hrs), Avg:98 F (36.7 C), Min:97.3 F (36.3 C), Max:99.3 F (37.4 C)  Recent Labs  Lab 01/09/22 1035 01/09/22 1109 01/09/22 1251 01/09/22 1747 01/10/22 0429 01/11/22 0250 01/11/22 0859 01/12/22 0529 01/13/22 0442 01/14/22 0532  WBC 20.4*  --   --   --  9.6  --  15.0* 16.7* 15.0* 17.5*  CREATININE 1.36*  --   --   --  1.08* 1.11*  --  1.08*  --  1.08*  LATICACIDVEN  --  1.9 2.8* 2.2*  --   --   --   --   --   --      Estimated Creatinine Clearance: 39.5 mL/min (A) (by C-G formula based on SCr of 1.08 mg/dL (H)).    No Known Allergies  Antimicrobials this admission:  5/18 Cefepime >> 5/21 5/18 Metronidazole >> 5/21 5/21 Zosyn >>   Microbiology results:  5/18 BCx: GNR (1/4 bottles, anaerobic bottle only), no BCID results 5/18 Ucx: NGF 5/19 MRSA PCR neg 5/21 Cdiff Ag neg; Toxin neg 5/22 Abscess: pending   Tawnya Crook, PharmD, BCPS Clinical Pharmacist 01/14/2022 10:16 AM

## 2022-01-14 NOTE — Progress Notes (Signed)
[facility] PROGRESS NOTE    Progress Note  Alexis Holmes  OEV:[id number] DOB: 29-Dec-1950 DOA: 01/09/2022 PCP: Holland Commons, FNP     Brief Narrative:   Alexis Holmes is an 71 y.o. female past medical history of hyperlipidemia and previous nephrolithiasis comes in complaining of abdominal pain that started on Tuesday located in her lower quadrant it has progressively gotten worse to the point where she cannot move.  White blood cell count was 20,000 CT scan showed appendix severely inflamed with the cecum and small bowel.  Hours.   Assessment/Plan:   Sepsis due to undetermined organism due to ileitis, regional ([place]) Continue to IV Zosyn. General surgery was consulted recommended a CT of the abdomen pelvis showed multiple abscesses. IR was consulted she is status post CT percutaneous drain placement. Diet per surgery, urine output is increasing. Started on IV Lasix continue to monitor her blood pressure. Continue IV narcotics. Blood cultures remain negative till date.   She has remained afebrile mild bump in her leukocytosis likely due to some transient bacteremia.   DVT prophylaxis: lovenox Family Communication:sister Status is: Inpatient Remains inpatient appropriate because: Sepsis due to regional enteritis.    Code Status:     Code Status Orders  (From admission, onward)           Start     Ordered   01/09/22 1826  Full code  Continuous        01/09/22 1826           Code Status History     This patient has a current code status but no historical code status.      Advance Directive Documentation    Flowsheet Row Most Recent Value  Type of Advance Directive Healthcare Power of Attorney  [Alexis Holmes]  Pre-existing out of facility DNR order (yellow form or pink MOST form) --  "MOST" Form in Place? --         IV Access:   Peripheral IV   Procedures and diagnostic studies:   CT ABDOMEN PELVIS W CONTRAST  Result Date:  01/12/2022 CLINICAL DATA:  Follow-up terminal ileitis. Left lower quadrant pain. EXAM: CT ABDOMEN AND PELVIS WITH CONTRAST TECHNIQUE: Multidetector CT imaging of the abdomen and pelvis was performed using the standard protocol following bolus administration of intravenous contrast. RADIATION DOSE REDUCTION: This exam was performed according to the departmental dose-optimization program which includes automated exposure control, adjustment of the mA and/or kV according to patient size and/or use of iterative reconstruction technique. CONTRAST:  169m OMNIPAQUE IOHEXOL 300 MG/ML  SOLN COMPARISON:  01/09/2022 FINDINGS: Lower Chest: New small to moderate bilateral pleural effusions and bilateral lower lobe atelectasis. Hepatobiliary: No hepatic masses identified. Contrast noted in the gallbladder, likely from recent CT, however there is no evidence of cholecystitis or biliary ductal dilatation. Pancreas:  No mass or inflammatory changes. Spleen: Within normal limits in size and appearance. Adrenals/Urinary Tract: No masses identified. No evidence of ureteral calculi or hydronephrosis. Foley catheter seen within the bladder. Stomach/Bowel: Moderate dilatation of small bowel loops is now seen with transition point in the right lower quadrant, consistent with small-bowel obstruction. Near the transition point in the right lower quadrant there is a complex multilocular fluid collection with peripheral rim enhancement measuring 6.3 x 2.2 cm on image 78/2. This is highly suspicious for ruptured appendicitis with abscess. Moderate wall thickening of the adjacent terminal ileum is likely reactive in etiology. There is an additional smaller rim enhancing fluid collection seen in the  right pelvic cul-de-sac area which measures 3.5 x 2.1 cm on image 80/2, also suspicious for abscess. Small amount of free fluid noted in the perihepatic space in more since pouch. Mild diffuse mesenteric and body wall edema also seen.  Vascular/Lymphatic: No pathologically enlarged lymph nodes. No acute vascular findings. Aortic atherosclerotic calcification incidentally noted. Reproductive:  No mass or other significant abnormality. Other:  None. Musculoskeletal:  No suspicious bone lesions identified. IMPRESSION: Development of distal small bowel obstruction, with transition point in right lower quadrant. 6.3 cm multilocular fluid collection with rim enhancement in right lower quadrant, highly suspicious for ruptured appendicitis with abscess. Smaller 3.5 cm rim enhancing fluid collection in right pelvic cul-de-sac, also suspicious for abscess. Moderate inflammation of terminal ileum, likely reactive in etiology. New small to moderate bilateral pleural effusions, and bilateral lower lobe atelectasis. Aortic Atherosclerosis (ICD10-I70.0). These results will be called to the ordering clinician or representative by the Radiologist Assistant, and communication documented in the PACS or [employer]. Electronically Signed   By: Marlaine Hind M.D.   On: 01/12/2022 10:13   DG CHEST PORT 1 VIEW  Result Date: 01/13/2022 CLINICAL DATA:  Dyspnea. Drainage of pelvic fluid collection this afternoon with subsequent episode of bacteremia. Rigors, tachycardia, tachypnea. EXAM: PORTABLE CHEST 1 VIEW COMPARISON:  AP chest 01/11/2022, 01/09/2022 FINDINGS: Cardiac silhouette and mediastinal contours are within normal limits. There are again low lung volumes. Elevation of the right hemidiaphragm is similar to prior. Small bilateral pleural effusions. No pneumothorax. No acute skeletal abnormality. IMPRESSION: Decreased lung volumes and small bilateral pleural effusions with bibasilar atelectasis, similar to prior. Electronically Signed   By: Yvonne Kendall M.D.   On: 01/13/2022 17:45     Medical Consultants:   None.   Subjective:    Alexis Holmes feels great has not required narcotics.  Wants to try something to eat.  Objective:    Vitals:    01/13/22 1835 01/13/22 1951 01/14/22 0053 01/14/22 0453  BP: (!) 100/54 (!) 96/56 (!) 97/54 (!) 93/56  Pulse: 100 100 68 67  Resp: '18 16 16 18  '$ Temp: 97.7 F (36.5 C) 97.7 F (36.5 C) (!) 97.3 F (36.3 C) (!) 97.4 F (36.3 C)  TempSrc: Oral Oral Oral Oral  SpO2: 94% 94% 97% 97%  Weight:      Height:       SpO2: 97 % O2 Flow Rate (L/min): 3 L/min   Intake/Output Summary (Last 24 hours) at 01/14/2022 0830 Last data filed at 01/14/2022 0826 Gross per 24 hour  Intake 946.71 ml  Output 175 ml  Net 771.71 ml    Filed Weights   01/09/22 1025 01/09/22 1723  Weight: 61.2 kg 61.2 kg    Exam: General exam: In no acute distress. Respiratory system: Good air movement and clear to auscultation. Cardiovascular system: S1 & S2 heard, RRR. No JVD. Gastrointestinal system: Abdomen is nondistended, soft, with range in place. Extremities: No pedal edema. Skin: No rashes, lesions or ulcers Psychiatry: Judgement and insight appear normal. Mood & affect appropriate.   Data Reviewed:    Labs: Basic Metabolic Panel: Recent Labs  Lab 01/09/22 1035 01/10/22 0429 01/11/22 0249 01/11/22 0250 01/12/22 0529 01/14/22 0532  NA 134* 135  --  140 140 139  K 3.8 5.1  --  3.9 3.3* 3.5  CL 97* 107  --  111 113* 108  CO2 24 21*  --  19* 18* 22  GLUCOSE 106* 84  --  72 87 97  BUN 16  18  --  '19 19 18  '$ CREATININE 1.36* 1.08*  --  1.11* 1.08* 1.08*  CALCIUM 9.0 7.5*  --  7.2* 7.7* 7.5*  MG 2.2  --  2.0  --   --   --   PHOS 3.0  --   --  2.0*  --   --     GFR Estimated Creatinine Clearance: 39.5 mL/min (A) (by C-G formula based on SCr of 1.08 mg/dL (H)). Liver Function Tests: Recent Labs  Lab 01/09/22 1035  AST 26  ALT 17  ALKPHOS 63  BILITOT 1.3*  PROT 7.5  ALBUMIN 4.1    Recent Labs  Lab 01/09/22 1035  LIPASE 37    No results for input(s): AMMONIA in the last 168 hours. Coagulation profile Recent Labs  Lab 01/13/22 1111  INR 1.1   COVID-19 Labs  No results for  input(s): DDIMER, FERRITIN, LDH, CRP in the last 72 hours.  Lab Results  Component Value Date   [place] NEGATIVE 02/16/2020    CBC: Recent Labs  Lab 01/09/22 1035 01/10/22 0429 01/11/22 0859 01/12/22 0529 01/13/22 0442 01/14/22 0532  WBC 20.4* 9.6 15.0* 16.7* 15.0* 17.5*  NEUTROABS 17.4*  --   --   --   --   --   HGB 12.9 10.1* 10.7* 10.6* 10.1* 9.9*  HCT 38.4 29.6* 33.7* 31.4* 30.0* 29.3*  MCV 93.2 93.7 99.7 93.7 93.5 94.2  PLT 315 210 278 334 327 281    Cardiac Enzymes: No results for input(s): CKTOTAL, CKMB, CKMBINDEX, TROPONINI in the last 168 hours. BNP (last 3 results) No results for input(s): PROBNP in the last 8760 hours. CBG: No results for input(s): GLUCAP in the last 168 hours. D-Dimer: No results for input(s): DDIMER in the last 72 hours. Hgb A1c: No results for input(s): HGBA1C in the last 72 hours. Lipid Profile: No results for input(s): CHOL, HDL, LDLCALC, TRIG, CHOLHDL, LDLDIRECT in the last 72 hours. Thyroid function studies: No results for input(s): TSH, T4TOTAL, T3FREE, THYROIDAB in the last 72 hours.  Invalid input(s): FREET3 Anemia work up: No results for input(s): VITAMINB12, FOLATE, FERRITIN, TIBC, IRON, RETICCTPCT in the last 72 hours. Sepsis Labs: Recent Labs  Lab 01/09/22 1109 01/09/22 1251 01/09/22 1747 01/10/22 0429 01/11/22 0859 01/12/22 0529 01/13/22 0442 01/14/22 0532  WBC  --   --   --    < > 15.0* 16.7* 15.0* 17.5*  LATICACIDVEN 1.9 2.8* 2.2*  --   --   --   --   --    < > = values in this interval not displayed.    Microbiology Recent Results (from the past 240 hour(s))  Urine Culture     Status: None   Collection Time: 01/09/22 11:09 AM   Specimen: Urine, Clean Catch  Result Value Ref Range Status   Specimen Description   Final    URINE, CLEAN CATCH Performed at [facility], [address] [place], [place] [address]    Special Requests   Final    NONE Performed at [facility], [address] [place], [place] [address]    Culture   Final    NO GROWTH Performed at [facility], [place] [address] [place], [place] [address]    Report Status 01/10/2022 FINAL  Final  Blood culture (routine x 2)     Status: None (Preliminary result)   Collection Time: 01/09/22 11:09 AM   Specimen: Left Antecubital; Blood  Result  Value Ref Range Status   Specimen Description   Final    LEFT ANTECUBITAL Performed at [facility], [address] [place], [place] [address]    Special Requests   Final    BOTTLES DRAWN AEROBIC AND ANAEROBIC Blood Culture adequate volume Performed at [facility], [address] [place], [place] [address]    Culture  Setup Time   Final    GRAM NEGATIVE RODS ANAEROBIC BOTTLE ONLY CRITICAL RESULT CALLED TO, READ BACK BY AND VERIFIED WITH: PHARMD ELLEN JACKSON 01/12/22'@2'$ :36 BY TW Performed at [facility], [place] [address] [place], [place] [address]    Culture GRAM NEGATIVE RODS  Final   Report Status PENDING  Incomplete  Blood culture (routine x 2)     Status: None   Collection Time: 01/09/22 11:09 AM   Specimen: Right Antecubital; Blood  Result Value Ref Range Status   Specimen Description   Final    RIGHT ANTECUBITAL Performed at [facility], [address] [place], [place] [address]    Special Requests   Final    BOTTLES DRAWN AEROBIC AND ANAEROBIC Blood Culture adequate volume Performed at [facility], [address] [place], [place] [address]    Culture   Final    NO GROWTH 5 DAYS Performed at [facility], [place] [address] [place], [place] [address]    Report Status 01/14/2022 FINAL  Final  Blood Culture ID Panel (Reflexed)     Status: None   Collection Time: 01/09/22 11:09 AM  Result Value Ref Range Status   Enterococcus faecalis NOT DETECTED NOT DETECTED Final   Enterococcus Faecium NOT DETECTED NOT DETECTED Final   Listeria monocytogenes NOT DETECTED NOT DETECTED Final    Staphylococcus species NOT DETECTED NOT DETECTED Final   Staphylococcus aureus (BCID) NOT DETECTED NOT DETECTED Final   Staphylococcus epidermidis NOT DETECTED NOT DETECTED Final   Staphylococcus lugdunensis NOT DETECTED NOT DETECTED Final   Streptococcus species NOT DETECTED NOT DETECTED Final   Streptococcus agalactiae NOT DETECTED NOT DETECTED Final   Streptococcus pneumoniae NOT DETECTED NOT DETECTED Final   Streptococcus pyogenes NOT DETECTED NOT DETECTED Final   A.calcoaceticus-baumannii NOT DETECTED NOT DETECTED Final   Bacteroides fragilis NOT DETECTED NOT DETECTED Final   Enterobacterales NOT DETECTED NOT DETECTED Final   Enterobacter cloacae complex NOT DETECTED NOT DETECTED Final   Escherichia coli NOT DETECTED NOT DETECTED Final   Klebsiella aerogenes NOT DETECTED NOT DETECTED Final   Klebsiella oxytoca NOT DETECTED NOT DETECTED Final   Klebsiella pneumoniae NOT DETECTED NOT DETECTED Final   Proteus species NOT DETECTED NOT DETECTED Final   Salmonella species NOT DETECTED NOT DETECTED Final   Serratia marcescens NOT DETECTED NOT DETECTED Final   Haemophilus influenzae NOT DETECTED NOT DETECTED Final   Neisseria meningitidis NOT DETECTED NOT DETECTED Final   Pseudomonas aeruginosa NOT DETECTED NOT DETECTED Final   Stenotrophomonas maltophilia NOT DETECTED NOT DETECTED Final   Candida albicans NOT DETECTED NOT DETECTED Final   Candida auris NOT DETECTED NOT DETECTED Final   Candida glabrata NOT DETECTED NOT DETECTED Final   Candida krusei NOT DETECTED NOT DETECTED Final   Candida parapsilosis NOT DETECTED NOT DETECTED Final   Candida tropicalis NOT DETECTED NOT DETECTED Final   Cryptococcus neoformans/gattii NOT DETECTED NOT DETECTED Final    Comment: Performed at [facility], [place] [address] [place], [place] [address]  MRSA Next Gen by  PCR, Nasal     Status: None   Collection Time: 01/10/22  5:43 AM   Specimen: Nasal Mucosa; Nasal Swab  Result Value Ref Range  Status   MRSA by PCR Next Gen NOT DETECTED NOT DETECTED Final    Comment: (NOTE) The GeneXpert MRSA Assay (FDA approved for NASAL specimens only), is one component of a comprehensive MRSA colonization surveillance program. It is not intended to diagnose MRSA infection nor to guide or monitor treatment for MRSA infections. Test performance is not FDA approved in patients less than 58 years old. Performed at [facility], [place] [address] [place], [place] [address]   C Difficile Quick Screen (NO PCR Reflex)     Status: None   Collection Time: 01/12/22  8:37 AM   Specimen: STOOL  Result Value Ref Range Status   C Diff antigen NEGATIVE NEGATIVE Final   C Diff toxin NEGATIVE NEGATIVE Final   C Diff interpretation No C. difficile detected.  Final    Comment: Performed at [facility], [place] [address] [place], [place] [address]  Aerobic/Anaerobic Culture w Gram Stain (surgical/deep wound)     Status: None (Preliminary result)   Collection Time: 01/13/22  3:34 PM   Specimen: Abscess  Result Value Ref Range Status   Specimen Description   Final    ABSCESS Performed at [place] [address] [place], [place] [address]    Special Requests   Final    NONE Performed at [facility], [place] [address], [place], [place] [address]    Gram Stain   Final    MODERATE WBC PRESENT, PREDOMINANTLY PMN ABUNDANT GRAM POSITIVE COCCI FEW GRAM NEGATIVE RODS Performed at [facility], [place] [address] [place], [place] [address]    Culture PENDING  Incomplete   Report Status PENDING  Incomplete     Medications:    Chlorhexidine Gluconate Cloth  6 each Topical Daily   enoxaparin (LOVENOX) injection  40 mg Subcutaneous Q24H   furosemide  40 mg Intravenous Once   furosemide  40 mg Intravenous Q12H   lip balm  1 application. Topical BID   mouth rinse  15 mL Mouth Rinse BID   meperidine (DEMEROL) injection  25 mg  Intravenous Once   sodium chloride flush  3 mL Intravenous Q12H   sodium chloride flush  5 mL Intracatheter Q8H   Continuous Infusions:  sodium chloride     sodium chloride 10 mL/hr at 01/14/22 0804   methocarbamol (ROBAXIN) IV     ondansetron (ZOFRAN) IV Stopped (01/11/22 0243)   piperacillin-tazobactam (ZOSYN)  IV 3.375 g (01/14/22 0804)      LOS: 5 days   Charlynne Cousins  [facility]  01/14/2022, 8:30 AM

## 2022-01-14 NOTE — Progress Notes (Signed)
Subjective/Chief Complaint: Patient feels 100% better than she has the whole admission.  Her abdomen is much less distended, her pain is significantly improved, and she is not having any nausea.   Objective: Vital signs in last 24 hours: Temp:  [97.3 F (36.3 C)-99.3 F (37.4 C)] 97.4 F (36.3 C) (05/23 0453) Pulse Rate:  [67-122] 67 (05/23 0453) Resp:  [16-35] 18 (05/23 0453) BP: (93-140)/(54-89) 93/56 (05/23 0453) SpO2:  [90 %-98 %] 97 % (05/23 0453) Last BM Date : 01/13/22  Intake/Output from previous day: 05/22 0701 - 05/23 0700 In: 946.7 [I.V.:711; IV Piggyback:230.7] Out: 25 [Drains:25] Intake/Output this shift: No intake/output data recorded.  General appearance: alert and cooperative Resp: normal effort today GI: RLQ tenderness is significantly improved today, TG drain in place with light tan, serous appearing output, +BS, much less distended today  Lab Results:  Recent Labs    01/13/22 0442 01/14/22 0532  WBC 15.0* 17.5*  HGB 10.1* 9.9*  HCT 30.0* 29.3*  PLT 327 281   BMET Recent Labs    01/12/22 0529 01/14/22 0532  NA 140 139  K 3.3* 3.5  CL 113* 108  CO2 18* 22  GLUCOSE 87 97  BUN 19 18  CREATININE 1.08* 1.08*  CALCIUM 7.7* 7.5*   PT/INR Recent Labs    01/13/22 1111  LABPROT 14.5  INR 1.1   ABG No results for input(s): PHART, HCO3 in the last 72 hours.  Invalid input(s): PCO2, PO2  Studies/Results: CT ABDOMEN PELVIS W CONTRAST  Result Date: 01/12/2022 CLINICAL DATA:  Follow-up terminal ileitis. Left lower quadrant pain. EXAM: CT ABDOMEN AND PELVIS WITH CONTRAST TECHNIQUE: Multidetector CT imaging of the abdomen and pelvis was performed using the standard protocol following bolus administration of intravenous contrast. RADIATION DOSE REDUCTION: This exam was performed according to the departmental dose-optimization program which includes automated exposure control, adjustment of the mA and/or kV according to patient size and/or use of  iterative reconstruction technique. CONTRAST:  1102m OMNIPAQUE IOHEXOL 300 MG/ML  SOLN COMPARISON:  01/09/2022 FINDINGS: Lower Chest: New small to moderate bilateral pleural effusions and bilateral lower lobe atelectasis. Hepatobiliary: No hepatic masses identified. Contrast noted in the gallbladder, likely from recent CT, however there is no evidence of cholecystitis or biliary ductal dilatation. Pancreas:  No mass or inflammatory changes. Spleen: Within normal limits in size and appearance. Adrenals/Urinary Tract: No masses identified. No evidence of ureteral calculi or hydronephrosis. Foley catheter seen within the bladder. Stomach/Bowel: Moderate dilatation of small bowel loops is now seen with transition point in the right lower quadrant, consistent with small-bowel obstruction. Near the transition point in the right lower quadrant there is a complex multilocular fluid collection with peripheral rim enhancement measuring 6.3 x 2.2 cm on image 78/2. This is highly suspicious for ruptured appendicitis with abscess. Moderate wall thickening of the adjacent terminal ileum is likely reactive in etiology. There is an additional smaller rim enhancing fluid collection seen in the right pelvic cul-de-sac area which measures 3.5 x 2.1 cm on image 80/2, also suspicious for abscess. Small amount of free fluid noted in the perihepatic space in more since pouch. Mild diffuse mesenteric and body wall edema also seen. Vascular/Lymphatic: No pathologically enlarged lymph nodes. No acute vascular findings. Aortic atherosclerotic calcification incidentally noted. Reproductive:  No mass or other significant abnormality. Other:  None. Musculoskeletal:  No suspicious bone lesions identified. IMPRESSION: Development of distal small bowel obstruction, with transition point in right lower quadrant. 6.3 cm multilocular fluid collection with rim  enhancement in right lower quadrant, highly suspicious for ruptured appendicitis with  abscess. Smaller 3.5 cm rim enhancing fluid collection in right pelvic cul-de-sac, also suspicious for abscess. Moderate inflammation of terminal ileum, likely reactive in etiology. New small to moderate bilateral pleural effusions, and bilateral lower lobe atelectasis. Aortic Atherosclerosis (ICD10-I70.0). These results will be called to the ordering clinician or representative by the Radiologist Assistant, and communication documented in the PACS or [employer]. Electronically Signed   By: Marlaine Hind M.D.   On: 01/12/2022 10:13   DG CHEST PORT 1 VIEW  Result Date: 01/13/2022 CLINICAL DATA:  Dyspnea. Drainage of pelvic fluid collection this afternoon with subsequent episode of bacteremia. Rigors, tachycardia, tachypnea. EXAM: PORTABLE CHEST 1 VIEW COMPARISON:  AP chest 01/11/2022, 01/09/2022 FINDINGS: Cardiac silhouette and mediastinal contours are within normal limits. There are again low lung volumes. Elevation of the right hemidiaphragm is similar to prior. Small bilateral pleural effusions. No pneumothorax. No acute skeletal abnormality. IMPRESSION: Decreased lung volumes and small bilateral pleural effusions with bibasilar atelectasis, similar to prior. Electronically Signed   By: Yvonne Kendall M.D.   On: 01/13/2022 17:45    Anti-infectives: Anti-infectives (From admission, onward)    Start     Dose/Rate Route Frequency Ordered Stop   01/12/22 1600  piperacillin-tazobactam (ZOSYN) IVPB 3.375 g        3.375 g 12.5 mL/hr over 240 Minutes Intravenous Every 8 hours 01/12/22 1313     01/09/22 2330  ceFEPIme (MAXIPIME) 2 g in sodium chloride 0.9 % 100 mL IVPB  Status:  Discontinued        2 g 200 mL/hr over 30 Minutes Intravenous Every 12 hours 01/09/22 1133 01/12/22 1246   01/09/22 2200  metroNIDAZOLE (FLAGYL) IVPB 500 mg  Status:  Discontinued        500 mg 100 mL/hr over 60 Minutes Intravenous Every 12 hours 01/09/22 1402 01/12/22 1246   01/09/22 1915  metroNIDAZOLE (FLAGYL) IVPB 500 mg   Status:  Discontinued        500 mg 100 mL/hr over 60 Minutes Intravenous Every 8 hours 01/09/22 1826 01/09/22 1828   01/09/22 1115  ceFEPIme (MAXIPIME) 2 g in sodium chloride 0.9 % 100 mL IVPB        2 g 200 mL/hr over 30 Minutes Intravenous  Once 01/09/22 1109 01/09/22 1156   01/09/22 1115  metroNIDAZOLE (FLAGYL) IVPB 500 mg        500 mg 100 mL/hr over 60 Minutes Intravenous  Once 01/09/22 1109 01/09/22 1224       Assessment/Plan: Terminal ileitis, possible appendicitis vs crohn's disease (IBD)  - CT scan shows severely inflamed terminal ileum, the appendix is adjacent to the terminal ileum and is also severely inflamed, adjacent cecum and small bowel loops appear mildly inflamed; findings are favored to represent a nonspecific infectious or inflammatory terminal ileitis including Crohn's disease; no evidence of perforation or well-defined/drainable fluid collection.  -patient much improved after drain placement yesterday -suspect elevation in WBC secondary to drain placement and will likely starting trending down tomorrow, inflammatory type response -CLD today, aDAT to fulls -mobilize, pulm toilet -cont zosyn, follow cultures,  prelim - gram + cocci and gram - rods -given how much better she feels, hopeful this is her turning point and we can cont to improve with conservative management with c-scope in 6-8 weeks and further surgical plans if warranted at that time. -discussed with primary team  FEN - CLD, ADA to fulls VTE - Lovenox ID -  zosyn  Shortness of breath - per medicine.  Diuresed some yesterday and breathing significantly better today.    LOS: 5 days   Henreitta Cea, PA-C See Amion for pager details   01/14/2022

## 2022-01-14 NOTE — Care Management Important Message (Signed)
Important Message  Patient Details IM Letter given to the Patient. Name: Alexis Holmes MRN: [id number] Date of Birth: [birth year]   Medicare Important Message Given:  Yes     Kerin Salen 01/14/2022, 11:03 AM

## 2022-01-15 DIAGNOSIS — K651 Peritoneal abscess: Secondary | ICD-10-CM | POA: Diagnosis not present

## 2022-01-15 DIAGNOSIS — A419 Sepsis, unspecified organism: Secondary | ICD-10-CM | POA: Diagnosis not present

## 2022-01-15 LAB — BASIC METABOLIC PANEL
Anion gap: 10 (ref 5–15)
BUN: 17 mg/dL (ref 8–23)
CO2: 29 mmol/L (ref 22–32)
Calcium: 8.1 mg/dL — ABNORMAL LOW (ref 8.9–10.3)
Chloride: 99 mmol/L (ref 98–111)
Creatinine, Ser: 1.1 mg/dL — ABNORMAL HIGH (ref 0.44–1.00)
GFR, Estimated: 53 mL/min — ABNORMAL LOW (ref >60)
Glucose, Bld: 98 mg/dL (ref 70–99)
Potassium: 2.8 mmol/L — ABNORMAL LOW (ref 3.5–5.1)
Sodium: 138 mmol/L (ref 135–145)

## 2022-01-15 LAB — CBC
HCT: 31 % — ABNORMAL LOW (ref 36.0–46.0)
Hemoglobin: 10.6 g/dL — ABNORMAL LOW (ref 12.0–15.0)
MCH: 31.7 pg (ref 26.0–34.0)
MCHC: 34.2 g/dL (ref 30.0–36.0)
MCV: 92.8 fL (ref 80.0–100.0)
Platelets: 362 10*3/uL (ref 150–400)
RBC: 3.34 MIL/uL — ABNORMAL LOW (ref 3.87–5.11)
RDW: 13.3 % (ref 11.5–15.5)
WBC: 17.9 10*3/uL — ABNORMAL HIGH (ref 4.0–10.5)
nRBC: 0.1 % (ref 0.0–0.2)

## 2022-01-15 LAB — MAGNESIUM: Magnesium: 1.8 mg/dL (ref 1.7–2.4)

## 2022-01-15 MED ORDER — POTASSIUM CHLORIDE 10 MEQ/100ML IV SOLN
10.0000 meq | INTRAVENOUS | Status: AC
  Administered 2022-01-15 (×4): 10 meq via INTRAVENOUS
  Filled 2022-01-15 (×2): qty 100

## 2022-01-15 MED ORDER — POTASSIUM CHLORIDE CRYS ER 20 MEQ PO TBCR
40.0000 meq | EXTENDED_RELEASE_TABLET | ORAL | Status: AC
  Administered 2022-01-15 (×2): 40 meq via ORAL
  Filled 2022-01-15 (×2): qty 2

## 2022-01-15 MED ORDER — CAMPHOR-MENTHOL 0.5-0.5 % EX LOTN: TOPICAL_LOTION | CUTANEOUS | Status: DC | PRN

## 2022-01-15 MED ORDER — MAGNESIUM SULFATE 2 GM/50ML IV SOLN
2.0000 g | Freq: Once | INTRAVENOUS | Status: AC
  Administered 2022-01-15: 2 g via INTRAVENOUS
  Filled 2022-01-15: qty 50

## 2022-01-15 MED ORDER — SODIUM CHLORIDE 0.9 % IV SOLN
100.0000 mg | INTRAVENOUS | Status: DC
  Administered 2022-01-15 – 2022-01-17 (×3): 100 mg via INTRAVENOUS
  Filled 2022-01-15 (×3): qty 5

## 2022-01-15 NOTE — Progress Notes (Signed)
[facility] PROGRESS NOTE    Progress Note  Alexis Holmes  BOF:[id number] DOB: Nov 02, 1950 DOA: 01/09/2022 PCP: Holland Commons, FNP     Brief Narrative:   Alexis Holmes is an 72 y.o. female past medical history of hyperlipidemia and previous nephrolithiasis comes in complaining of abdominal pain that started on Tuesday located in her lower quadrant it has progressively gotten worse to the point where she cannot move.  White blood cell count was 20,000 CT scan showed appendix severely inflamed with the cecum and small bowel.  .   Assessment/Plan:   Ruptured appendix versus terminal ileitis with intra-abdominal abscess/sepsis present on admission  General surgery was consulted.  CT scan showed multiple abscesses.  Interventional radiology was consulted and the patient underwent CT-guided drain placement.  Cultures are pending.  Patient remains on Zosyn.  Experiencing abdominal distention and this morning which resolved after she had a bowel movement.  Continue current management for now. She is afebrile.  WBC remains elevated at 17.9.  Hypokalemia Replace potassium.  Magnesium is 1.8.   DVT prophylaxis: lovenox CODE STATUS: Full code Family Communication:sister Disposition: Hopefully return home in improved  Status is: Inpatient Remains inpatient appropriate because: Sepsis due to regional enteritis.    IV Access:   Peripheral IV   Procedures and diagnostic studies:   DG CHEST PORT 1 VIEW  Result Date: 01/13/2022 CLINICAL DATA:  Dyspnea. Drainage of pelvic fluid collection this afternoon with subsequent episode of bacteremia. Rigors, tachycardia, tachypnea. EXAM: PORTABLE CHEST 1 VIEW COMPARISON:  AP chest 01/11/2022, 01/09/2022 FINDINGS: Cardiac silhouette and mediastinal contours are within normal limits. There are again low lung volumes. Elevation of the right hemidiaphragm is similar to prior. Small bilateral pleural effusions. No pneumothorax. No acute  skeletal abnormality. IMPRESSION: Decreased lung volumes and small bilateral pleural effusions with bibasilar atelectasis, similar to prior. Electronically Signed   By: Yvonne Kendall M.D.   On: 01/13/2022 17:45     Medical Consultants:   General surgery   Subjective:   Patient complained of abdominal distention this morning.  No nausea or vomiting.  No shortness of breath.  Patient reevaluated after couple hours.  Mentions that her distention improved after she had a bowel movement.  Objective:    Vitals:   01/14/22 0453 01/14/22 1638 01/14/22 2150 01/15/22 0514  BP: (!) 93/56 (!) 116/56 112/60 112/63  Pulse: 67 92 89 79  Resp: '18 15 17 17  '$ Temp: (!) 97.4 F (36.3 C) 98.5 F (36.9 C) 98.9 F (37.2 C) 98.2 F (36.8 C)  TempSrc: Oral Oral Oral Oral  SpO2: 97% 95% 93% 94%  Weight:      Height:       SpO2: 94 % O2 Flow Rate (L/min): 3 L/min   Intake/Output Summary (Last 24 hours) at 01/15/2022 1043 Last data filed at 01/15/2022 0930 Gross per 24 hour  Intake 871.08 ml  Output 2500 ml  Net -1628.92 ml    Filed Weights   01/09/22 1025 01/09/22 1723  Weight: 61.2 kg 61.2 kg    Exam:  General appearance: Awake alert.  In no distress Resp: Clear to auscultation bilaterally.  Normal effort Cardio: S1-S2 is normal regular.  No S3-S4.  No rubs murmurs or bruit GI: Abdomen is soft.  Nontender nondistended.  Bowel sounds are present normal.  No masses organomegaly Extremities: No edema.  Full range of motion of lower extremities. Neurologic: Alert and oriented x3.  No focal neurological deficits.      Data Reviewed:  Labs: Basic Metabolic Panel: Recent Labs  Lab 01/09/22 1035 01/10/22 0429 01/11/22 0249 01/11/22 0250 01/12/22 0529 01/14/22 0532 01/15/22 0524  NA 134* 135  --  140 140 139 138  K 3.8 5.1  --  3.9 3.3* 3.5 2.8*  CL 97* 107  --  111 113* 108 99  CO2 24 21*  --  19* 18* 22 29  GLUCOSE 106* 84  --  72 87 97 98  BUN 16 18  --  '19 19 18 17   '$ CREATININE 1.36* 1.08*  --  1.11* 1.08* 1.08* 1.10*  CALCIUM 9.0 7.5*  --  7.2* 7.7* 7.5* 8.1*  MG 2.2  --  2.0  --   --   --  1.8  PHOS 3.0  --   --  2.0*  --   --   --     GFR Estimated Creatinine Clearance: 38.8 mL/min (A) (by C-G formula based on SCr of 1.1 mg/dL (H)).  Liver Function Tests: Recent Labs  Lab 01/09/22 1035  AST 26  ALT 17  ALKPHOS 63  BILITOT 1.3*  PROT 7.5  ALBUMIN 4.1    Recent Labs  Lab 01/09/22 1035  LIPASE 37    Coagulation profile Recent Labs  Lab 01/13/22 1111  INR 1.1    CBC: Recent Labs  Lab 01/09/22 1035 01/10/22 0429 01/11/22 0859 01/12/22 0529 01/13/22 0442 01/14/22 0532 01/15/22 0524  WBC 20.4*   < > 15.0* 16.7* 15.0* 17.5* 17.9*  NEUTROABS 17.4*  --   --   --   --   --   --   HGB 12.9   < > 10.7* 10.6* 10.1* 9.9* 10.6*  HCT 38.4   < > 33.7* 31.4* 30.0* 29.3* 31.0*  MCV 93.2   < > 99.7 93.7 93.5 94.2 92.8  PLT 315   < > 278 334 327 281 362   < > = values in this interval not displayed.    Sepsis Labs: Recent Labs  Lab 01/09/22 1109 01/09/22 1251 01/09/22 1747 01/10/22 0429 01/12/22 0529 01/13/22 0442 01/14/22 0532 01/15/22 0524  WBC  --   --   --    < > 16.7* 15.0* 17.5* 17.9*  LATICACIDVEN 1.9 2.8* 2.2*  --   --   --   --   --    < > = values in this interval not displayed.    Microbiology Recent Results (from the past 240 hour(s))  Urine Culture     Status: None   Collection Time: 01/09/22 11:09 AM   Specimen: Urine, Clean Catch  Result Value Ref Range Status   Specimen Description   Final    URINE, CLEAN CATCH Performed at [facility], [address] [place], [place] [address]    Special Requests   Final    NONE Performed at [facility], [address] [place], [place] [address]    Culture   Final    NO GROWTH Performed at [facility], [place] [address] [place], [place] [address]    Report Status 01/10/2022 FINAL  Final  Blood culture (routine x 2)     Status:  Abnormal   Collection Time: 01/09/22 11:09 AM   Specimen: Left Antecubital; Blood  Result Value Ref Range Status   Specimen Description   Final    LEFT ANTECUBITAL Performed at [facility], [address] [place], [place] [address]    Special Requests  Final    BOTTLES DRAWN AEROBIC AND ANAEROBIC Blood Culture adequate volume Performed at [facility], [address] [place], [place] [address]    Culture  Setup Time   Final    GRAM NEGATIVE RODS ANAEROBIC BOTTLE ONLY CRITICAL RESULT CALLED TO, READ BACK BY AND VERIFIED WITH: PHARMD ELLEN JACKSON 01/12/22'@2'$ :36 BY TW    Culture (A)  Final    BACTEROIDES THETAIOTAOMICRON BETA LACTAMASE POSITIVE Performed at [facility], [place] [address] [place], [place] [address]    Report Status 01/14/2022 FINAL  Final  Blood culture (routine x 2)     Status: None   Collection Time: 01/09/22 11:09 AM   Specimen: Right Antecubital; Blood  Result Value Ref Range Status   Specimen Description   Final    RIGHT ANTECUBITAL Performed at [facility], [address] [place], [place] [address]    Special Requests   Final    BOTTLES DRAWN AEROBIC AND ANAEROBIC Blood Culture adequate volume Performed at [facility], [address] [place], [place] [address]    Culture   Final    NO GROWTH 5 DAYS Performed at [facility], [place] [address] [place], [place] [address]    Report Status 01/14/2022 FINAL  Final  Blood Culture ID Panel (Reflexed)     Status: None   Collection Time: 01/09/22 11:09 AM  Result Value Ref Range Status   Enterococcus faecalis NOT DETECTED NOT DETECTED Final   Enterococcus Faecium NOT DETECTED NOT DETECTED Final   Listeria monocytogenes NOT DETECTED NOT DETECTED Final   Staphylococcus species NOT DETECTED NOT DETECTED Final   Staphylococcus aureus (BCID) NOT DETECTED NOT DETECTED Final   Staphylococcus epidermidis NOT DETECTED NOT DETECTED Final   Staphylococcus  lugdunensis NOT DETECTED NOT DETECTED Final   Streptococcus species NOT DETECTED NOT DETECTED Final   Streptococcus agalactiae NOT DETECTED NOT DETECTED Final   Streptococcus pneumoniae NOT DETECTED NOT DETECTED Final   Streptococcus pyogenes NOT DETECTED NOT DETECTED Final   A.calcoaceticus-baumannii NOT DETECTED NOT DETECTED Final   Bacteroides fragilis NOT DETECTED NOT DETECTED Final   Enterobacterales NOT DETECTED NOT DETECTED Final   Enterobacter cloacae complex NOT DETECTED NOT DETECTED Final   Escherichia coli NOT DETECTED NOT DETECTED Final   Klebsiella aerogenes NOT DETECTED NOT DETECTED Final   Klebsiella oxytoca NOT DETECTED NOT DETECTED Final   Klebsiella pneumoniae NOT DETECTED NOT DETECTED Final   Proteus species NOT DETECTED NOT DETECTED Final   Salmonella species NOT DETECTED NOT DETECTED Final   Serratia marcescens NOT DETECTED NOT DETECTED Final   Haemophilus influenzae NOT DETECTED NOT DETECTED Final   Neisseria meningitidis NOT DETECTED NOT DETECTED Final   Pseudomonas aeruginosa NOT DETECTED NOT DETECTED Final   Stenotrophomonas maltophilia NOT DETECTED NOT DETECTED Final   Candida albicans NOT DETECTED NOT DETECTED Final   Candida auris NOT DETECTED NOT DETECTED Final   Candida glabrata NOT DETECTED NOT DETECTED Final   Candida krusei NOT DETECTED NOT DETECTED Final   Candida parapsilosis NOT DETECTED NOT DETECTED Final   Candida tropicalis NOT DETECTED NOT DETECTED Final   Cryptococcus neoformans/gattii NOT DETECTED NOT DETECTED Final    Comment: Performed at [facility], [place] [address], [place], [place] [address]  MRSA Next Gen by PCR, Nasal     Status: None   Collection Time: 01/10/22  5:43 AM   Specimen: Nasal Mucosa; Nasal Swab  Result Value Ref Range Status  MRSA by PCR Next Gen NOT DETECTED NOT DETECTED Final    Comment: (NOTE) The GeneXpert MRSA Assay (FDA approved for NASAL specimens only), is one component of a comprehensive MRSA colonization  surveillance program. It is not intended to diagnose MRSA infection nor to guide or monitor treatment for MRSA infections. Test performance is not FDA approved in patients less than 75 years old. Performed at [facility], [place] [address], [place], [place] [address]   C Difficile Quick Screen (NO PCR Reflex)     Status: None   Collection Time: 01/12/22  8:37 AM   Specimen: STOOL  Result Value Ref Range Status   C Diff antigen NEGATIVE NEGATIVE Final   C Diff toxin NEGATIVE NEGATIVE Final   C Diff interpretation No C. difficile detected.  Final    Comment: Performed at [facility], [place] [address], [place], [place] [address]  Aerobic/Anaerobic Culture w Gram Stain (surgical/deep wound)     Status: None (Preliminary result)   Collection Time: 01/13/22  3:34 PM   Specimen: Abscess  Result Value Ref Range Status   Specimen Description   Final    ABSCESS Performed at [place] [address], [place], [place] [address]    Special Requests   Final    NONE Performed at [facility], [place] [address], [place], [place] [address]    Gram Stain   Final    MODERATE WBC PRESENT, PREDOMINANTLY PMN ABUNDANT GRAM POSITIVE COCCI FEW GRAM NEGATIVE RODS    Culture   Final    TOO YOUNG TO READ Performed at [facility], [place] [address], [place], [place] [address]    Report Status PENDING  Incomplete     Medications:    Chlorhexidine Gluconate Cloth  6 each Topical Daily   enoxaparin (LOVENOX) injection  40 mg Subcutaneous Q24H   lip balm  1 application. Topical BID   mouth rinse  15 mL Mouth Rinse BID   meperidine (DEMEROL) injection  25 mg Intravenous Once   potassium chloride  40 mEq Oral Q4H   sodium chloride flush  3 mL Intravenous Q12H   sodium chloride flush  5 mL Intracatheter Q8H   Continuous Infusions:  sodium chloride     sodium chloride 10 mL/hr at 01/15/22 0417   methocarbamol (ROBAXIN) IV      micafungin (MYCAMINE) IV     ondansetron (ZOFRAN) IV Stopped (01/11/22 0243)   piperacillin-tazobactam (ZOSYN)  IV 3.375 g (01/14/22 2358)   potassium chloride 10 mEq (01/15/22 0944)      LOS: 6 days   [employer]  [facility]  01/15/2022, 10:43 AM

## 2022-01-15 NOTE — Progress Notes (Signed)
Referring Physician(s): Gross,S  Supervising Physician: Delma Post  Patient Status:  [facility] - In-pt  Chief Complaint: Pelvic fluid collection   Subjective: Pt doing fairly well today; denies worsening abd pain,N/V; bilious fluid noted in pelvic drain   Allergies: Patient has no known allergies.  Medications: Prior to Admission medications   Medication Sig Start Date End Date Taking? Authorizing Provider  alendronate (FOSAMAX) 70 MG tablet Take 70 mg by mouth once a week. 02/26/21  Yes [provider]  aspirin 81 MG chewable tablet Chew 81 mg by mouth daily.   Yes [provider]  B Complex Vitamins (B COMPLEX 100 PO) Take 1 tablet by mouth daily.   Yes [provider]  Biotin 1 MG CAPS Take 1 mg by mouth daily.   Yes [provider]  Calcium-Phosphorus-Vitamin D (CITRACAL +D3 PO) Take 1 tablet by mouth daily.   Yes [provider]  Cholecalciferol (VITAMIN D) 125 MCG (5000 UT) CAPS Take 5,000 Units by mouth daily.   Yes [provider]  DULoxetine (CYMBALTA) 20 MG capsule Take 20 mg by mouth daily.   Yes [provider]  ibuprofen (ADVIL) 200 MG tablet Take 200-400 mg by mouth every 6 (six) hours as needed for moderate pain.   Yes [provider]  Lecithin 1200 MG CAPS Take 1,200 mg by mouth daily.   Yes [provider]  Lutein 40 MG CAPS Take 40 mg by mouth daily.   Yes [provider]  magnesium 30 MG tablet Take 30 mg by mouth daily.   Yes [provider]  magnesium hydroxide (MILK OF MAGNESIA) 400 MG/5ML suspension Take 30 mLs by mouth daily as needed for mild constipation.   Yes [provider]  Omega-3 Fatty Acids (FISH OIL) 1000 MG CAPS Take 1,000 mg by mouth daily.   Yes [provider]  psyllium (METAMUCIL) 58.6 % powder Take 1 packet by mouth daily as needed (constipation).   Yes [provider]  Turmeric 500 MG CAPS Take 500 mg by mouth daily.    Yes [provider]  vitamin B-12 (CYANOCOBALAMIN) 1000 MCG tablet Take 1,000 mcg by mouth daily.   Yes [provider]  vitamin C (ASCORBIC ACID) 500 MG tablet Take 500 mg by mouth daily.   Yes [provider]  cyclobenzaprine (FLEXERIL) 5 MG tablet Take 1 tablet (5 mg total) by mouth at bedtime. Patient not taking: Reported on 01/09/2022 12/02/21   Raylene Everts, MD  naproxen sodium (ANAPROX DS) 550 MG tablet Take 1 tablet (550 mg total) by mouth 2 (two) times daily with a meal. Patient not taking: Reported on 01/09/2022 12/02/21   Raylene Everts, MD     Vital Signs: BP 113/62 (BP Location: Left Arm)   Pulse 96   Temp 97.9 F (36.6 C) (Oral)   Resp 18   Ht '5\' 1"'$  (1.549 m)   Wt 135 lb (61.2 kg)   SpO2 95%   BMI 25.51 kg/m   Physical Exam awake/alert; rt TG/pelvic drain intact, dressing clean and dry, mildly tender, OP 50 cc bilious fluid; drain flushed without difficulty   Imaging: CT ABDOMEN PELVIS W CONTRAST  Result Date: 01/12/2022 CLINICAL DATA:  Follow-up terminal ileitis. Left lower quadrant pain. EXAM: CT ABDOMEN AND PELVIS WITH CONTRAST TECHNIQUE: Multidetector CT imaging of the abdomen and pelvis was performed using the standard protocol following bolus administration of intravenous contrast. RADIATION DOSE REDUCTION: This exam was performed according to the departmental  dose-optimization program which includes automated exposure control, adjustment of the mA and/or kV according to patient size and/or use of iterative reconstruction technique. CONTRAST:  144m OMNIPAQUE IOHEXOL 300 MG/ML  SOLN COMPARISON:  01/09/2022 FINDINGS: Lower Chest: New small to moderate bilateral pleural effusions and bilateral lower lobe atelectasis. Hepatobiliary: No hepatic masses identified. Contrast noted in the gallbladder, likely from recent CT, however there is no evidence of cholecystitis or biliary ductal dilatation. Pancreas:  No mass or inflammatory changes.  Spleen: Within normal limits in size and appearance. Adrenals/Urinary Tract: No masses identified. No evidence of ureteral calculi or hydronephrosis. Foley catheter seen within the bladder. Stomach/Bowel: Moderate dilatation of small bowel loops is now seen with transition point in the right lower quadrant, consistent with small-bowel obstruction. Near the transition point in the right lower quadrant there is a complex multilocular fluid collection with peripheral rim enhancement measuring 6.3 x 2.2 cm on image 78/2. This is highly suspicious for ruptured appendicitis with abscess. Moderate wall thickening of the adjacent terminal ileum is likely reactive in etiology. There is an additional smaller rim enhancing fluid collection seen in the right pelvic cul-de-sac area which measures 3.5 x 2.1 cm on image 80/2, also suspicious for abscess. Small amount of free fluid noted in the perihepatic space in more since pouch. Mild diffuse mesenteric and body wall edema also seen. Vascular/Lymphatic: No pathologically enlarged lymph nodes. No acute vascular findings. Aortic atherosclerotic calcification incidentally noted. Reproductive:  No mass or other significant abnormality. Other:  None. Musculoskeletal:  No suspicious bone lesions identified. IMPRESSION: Development of distal small bowel obstruction, with transition point in right lower quadrant. 6.3 cm multilocular fluid collection with rim enhancement in right lower quadrant, highly suspicious for ruptured appendicitis with abscess. Smaller 3.5 cm rim enhancing fluid collection in right pelvic cul-de-sac, also suspicious for abscess. Moderate inflammation of terminal ileum, likely reactive in etiology. New small to moderate bilateral pleural effusions, and bilateral lower lobe atelectasis. Aortic Atherosclerosis (ICD10-I70.0). These results will be called to the ordering clinician or representative by the Radiologist Assistant, and communication documented in the PACS  or [employer] Electronically Signed   By: JMarlaine HindM.D.   On: 01/12/2022 10:13   DG CHEST PORT 1 VIEW  Result Date: 01/13/2022 CLINICAL DATA:  Dyspnea. Drainage of pelvic fluid collection this afternoon with subsequent episode of bacteremia. Rigors, tachycardia, tachypnea. EXAM: PORTABLE CHEST 1 VIEW COMPARISON:  AP chest 01/11/2022, 01/09/2022 FINDINGS: Cardiac silhouette and mediastinal contours are within normal limits. There are again low lung volumes. Elevation of the right hemidiaphragm is similar to prior. Small bilateral pleural effusions. No pneumothorax. No acute skeletal abnormality. IMPRESSION: Decreased lung volumes and small bilateral pleural effusions with bibasilar atelectasis, similar to prior. Electronically Signed   By: RYvonne KendallM.D.   On: 01/13/2022 17:45   CT IMAGE GUIDED DRAINAGE BY PERCUTANEOUS CATHETER  Result Date: 01/15/2022 INDICATION: Patient has a small 4 x 2.3 cm loculated abscess at the right lower pelvis requiring drain insertion EXAM: CT-guided 10 French drainage catheter insertion into the right lower pelvic absence. TECHNIQUE: Multidetector CT imaging of the was performed following the standard protocol with/without IV contrast. RADIATION DOSE REDUCTION: This exam was performed according to the departmental dose-optimization program which includes automated exposure control, adjustment of the mA and/or kV according to patient size and/or use of iterative reconstruction technique. MEDICATIONS: The patient is currently admitted to the hospital and receiving intravenous antibiotics. The antibiotics were administered within an appropriate time frame prior to  the initiation of the procedure. ANESTHESIA/SEDATION: Moderate (conscious) sedation was employed during this procedure. A total of Versed 2.0 mg and Fentanyl 100 mcg was administered intravenously by the radiology nurse. Total intra-service moderate Sedation Time: 30 minutes. The patient's level of  consciousness and vital signs were monitored continuously by radiology nursing throughout the procedure under my direct supervision. COMPLICATIONS: None immediate. PROCEDURE: Informed written consent was obtained from the patient after a thorough discussion of the procedural risks, benefits and alternatives. All questions were addressed. Maximal Sterile Barrier Technique was utilized including caps, mask, sterile gowns, sterile gloves, sterile drape, hand hygiene and skin antiseptic. A timeout was performed prior to the initiation of the procedure. Patient was placed prone on the CT table and selected CT imaging of the abscess area was performed. Skin corresponding to the abscess pocket was marked, prepped, draped and anesthetized with 1% xylocaine with the needle to be inserted by the right posterolateral transgluteal approach. Under CT guidance, 18 gauge trocar needle was inserted into the abscess pocket. The inner stylet was removed, 035 inch Amplatz wire was introduced, access site was dilated and a 10 French drainage catheter was inserted to form the Cope loop within the abscess pocket. Approximately 10 mL of thin foul-smelling pus was drained. Catheter was sutured to the skin using 2-0 silk and was connected to a JP bulb. Sterile dressing was placed. The specimen was sent to the lab for culture and sensitivity. Patient tolerated the procedure well. IMPRESSION: CT-guided drain insertion into the right lower pelvic abscess. Electronically Signed   By: Frazier Richards M.D.   On: 01/15/2022 11:27    Labs:  CBC: Recent Labs    01/12/22 0529 01/13/22 0442 01/14/22 0532 01/15/22 0524  WBC 16.7* 15.0* 17.5* 17.9*  HGB 10.6* 10.1* 9.9* 10.6*  HCT 31.4* 30.0* 29.3* 31.0*  PLT 334 327 281 362    COAGS: Recent Labs    01/13/22 1111  INR 1.1    BMP: Recent Labs    01/11/22 0250 01/12/22 0529 01/14/22 0532 01/15/22 0524  NA 140 140 139 138  K 3.9 3.3* 3.5 2.8*  CL 111 113* 108 99  CO2 19* 18*  22 29  GLUCOSE 72 87 97 98  BUN '19 19 18 17  '$ CALCIUM 7.2* 7.7* 7.5* 8.1*  CREATININE 1.11* 1.08* 1.08* 1.10*  GFRNONAA 53* 55* 55* 53*    LIVER FUNCTION TESTS: Recent Labs    01/09/22 1035  BILITOT 1.3*  AST 26  ALT 17  ALKPHOS 45  PROT 7.5  ALBUMIN 4.1    Assessment and Plan: 71 y.o. female with past medical history of arthritis, colon polyps, hypercholesterolemia, nephrolithiasis ,vitamin D deficiency who was admitted to [facility] on 01/09/2022 with worsening lower abdominal pain and diminished appetite.  In the ED patient was noted to be febrile with leukocytosis of 20,000 lactic acid of 2.8 and CT revealing severely inflamed appendix/cecum/small bowel likely secondary to adjacent acute appendicitis; s/p pelvic fluid collection drainage 5/22 (10 fr to JP); afebrile;  WBC 17.9(17.5), hgb 10.6(9.9), K 2.8- replace; creat 1.10(1.08); drain fluid cx pend; closely monitor output; check final cx data; obtain f/u CT if WBC rises sig or once output minimal; may need drain injection before removal  Electronically Signed: D. Rowe Robert, PA-C 01/15/2022, 2:24 PM   I spent a total of 15 Minutes at the the patient's bedside AND on the patient's hospital floor or unit, greater than 50% of which was counseling/coordinating care for pelvic fluid collection  drain    Patient ID: Alexis Holmes, female   DOB: [birth year], 71 y.o.   MRN: [id number]

## 2022-01-16 DIAGNOSIS — K651 Peritoneal abscess: Secondary | ICD-10-CM | POA: Diagnosis not present

## 2022-01-16 LAB — BASIC METABOLIC PANEL
Anion gap: 9 (ref 5–15)
BUN: 20 mg/dL (ref 8–23)
CO2: 25 mmol/L (ref 22–32)
Calcium: 8.3 mg/dL — ABNORMAL LOW (ref 8.9–10.3)
Chloride: 105 mmol/L (ref 98–111)
Creatinine, Ser: 1 mg/dL (ref 0.44–1.00)
GFR, Estimated: 60 mL/min — ABNORMAL LOW (ref >60)
Glucose, Bld: 85 mg/dL (ref 70–99)
Potassium: 3.9 mmol/L (ref 3.5–5.1)
Sodium: 139 mmol/L (ref 135–145)

## 2022-01-16 LAB — CBC
HCT: 33.2 % — ABNORMAL LOW (ref 36.0–46.0)
Hemoglobin: 11.2 g/dL — ABNORMAL LOW (ref 12.0–15.0)
MCH: 31.5 pg (ref 26.0–34.0)
MCHC: 33.7 g/dL (ref 30.0–36.0)
MCV: 93.3 fL (ref 80.0–100.0)
Platelets: 403 10*3/uL — ABNORMAL HIGH (ref 150–400)
RBC: 3.56 MIL/uL — ABNORMAL LOW (ref 3.87–5.11)
RDW: 13 % (ref 11.5–15.5)
WBC: 16.7 10*3/uL — ABNORMAL HIGH (ref 4.0–10.5)
nRBC: 0 % (ref 0.0–0.2)

## 2022-01-16 LAB — MAGNESIUM: Magnesium: 2.4 mg/dL (ref 1.7–2.4)

## 2022-01-16 MED ORDER — SACCHAROMYCES BOULARDII 250 MG PO CAPS
250.0000 mg | ORAL_CAPSULE | Freq: Two times a day (BID) | ORAL | Status: DC
  Administered 2022-01-16 – 2022-01-17 (×3): 250 mg via ORAL
  Filled 2022-01-16 (×3): qty 1

## 2022-01-16 MED ORDER — BOOST / RESOURCE BREEZE PO LIQD CUSTOM
1.0000 | Freq: Three times a day (TID) | ORAL | Status: DC
  Administered 2022-01-16 – 2022-01-17 (×4): 1 via ORAL

## 2022-01-16 NOTE — Progress Notes (Signed)
Patient ID: Alexis Holmes, female   DOB: 20-Apr-1951, 71 y.o.   MRN: [id number] [facility] Surgery Progress Note     Subjective: CC-  Feeling much better than yesterday. States that bloating and nausea have improved, although not fully resolved. Tolerating clear liquids. Passing flatus and continues to have loose stools. Stools with a little more substance to them. WBC down 16.7, afebrile.  Objective: Vital signs in last 24 hours: Temp:  [97.9 F (36.6 C)-98.9 F (37.2 C)] 98.2 F (36.8 C) (05/25 0526) Pulse Rate:  [76-96] 76 (05/25 0526) Resp:  [18] 18 (05/25 0526) BP: (113-122)/(58-62) 122/61 (05/25 0526) SpO2:  [92 %-96 %] 92 % (05/25 0526) Last BM Date : 01/15/22  Intake/Output from previous day: 05/24 0701 - 05/25 0700 In: 1055.3 [P.O.:240; IV Piggyback:805.3] Out: 955 [Urine:650; Drains:55; Stool:250] Intake/Output this shift: No intake/output data recorded.  PE: Gen: alert, NAD Resp: rate and effort normal on room air GI: mild RLQ TTP without rebound or guarding, mild distension, TG drain in place with scant thin bilious fluid, +BS  Lab Results:  Recent Labs    01/15/22 0524 01/16/22 0541  WBC 17.9* 16.7*  HGB 10.6* 11.2*  HCT 31.0* 33.2*  PLT 362 403*   BMET Recent Labs    01/15/22 0524 01/16/22 0541  NA 138 139  K 2.8* 3.9  CL 99 105  CO2 29 25  GLUCOSE 98 85  BUN 17 20  CREATININE 1.10* 1.00  CALCIUM 8.1* 8.3*   PT/INR Recent Labs    01/13/22 1111  LABPROT 14.5  INR 1.1   CMP     Component Value Date/Time   NA 139 01/16/2022 0541   K 3.9 01/16/2022 0541   CL 105 01/16/2022 0541   CO2 25 01/16/2022 0541   GLUCOSE 85 01/16/2022 0541   BUN 20 01/16/2022 0541   CREATININE 1.00 01/16/2022 0541   CALCIUM 8.3 (L) 01/16/2022 0541   PROT 7.5 01/09/2022 1035   ALBUMIN 4.1 01/09/2022 1035   AST 26 01/09/2022 1035   ALT 17 01/09/2022 1035   ALKPHOS 63 01/09/2022 1035   BILITOT 1.3 (H) 01/09/2022 1035   GFRNONAA 60 (L) 01/16/2022 0541    Lipase     Component Value Date/Time   LIPASE 37 01/09/2022 1035       Studies/Results: No results found.  Anti-infectives: Anti-infectives (From admission, onward)    Start     Dose/Rate Route Frequency Ordered Stop   01/15/22 1000  micafungin (MYCAMINE) 100 mg in sodium chloride 0.9 % 100 mL IVPB       Note to Pharmacy: Pharmacy may adjust dosing strength, schedule, rate of infusion, etc as needed to optimize therapy   100 mg 110 mL/hr over 1 Hours Intravenous Every 24 hours 01/15/22 0842 01/20/22 0959   01/12/22 1600  piperacillin-tazobactam (ZOSYN) IVPB 3.375 g        3.375 g 12.5 mL/hr over 240 Minutes Intravenous Every 8 hours 01/12/22 1313     01/09/22 2330  ceFEPIme (MAXIPIME) 2 g in sodium chloride 0.9 % 100 mL IVPB  Status:  Discontinued        2 g 200 mL/hr over 30 Minutes Intravenous Every 12 hours 01/09/22 1133 01/12/22 1246   01/09/22 2200  metroNIDAZOLE (FLAGYL) IVPB 500 mg  Status:  Discontinued        500 mg 100 mL/hr over 60 Minutes Intravenous Every 12 hours 01/09/22 1402 01/12/22 1246   01/09/22 1915  metroNIDAZOLE (FLAGYL) IVPB 500 mg  Status:  Discontinued        500 mg 100 mL/hr over 60 Minutes Intravenous Every 8 hours 01/09/22 1826 01/09/22 1828   01/09/22 1115  ceFEPIme (MAXIPIME) 2 g in sodium chloride 0.9 % 100 mL IVPB        2 g 200 mL/hr over 30 Minutes Intravenous  Once 01/09/22 1109 01/09/22 1156   01/09/22 1115  metroNIDAZOLE (FLAGYL) IVPB 500 mg        500 mg 100 mL/hr over 60 Minutes Intravenous  Once 01/09/22 1109 01/09/22 1224        Assessment/Plan Terminal ileitis, possible appendicitis vs crohn's disease (IBD)  - CT scan shows severely inflamed terminal ileum, the appendix is adjacent to the terminal ileum and is also severely inflamed, adjacent cecum and small bowel loops appear mildly inflamed; findings are favored to represent a nonspecific infectious or inflammatory terminal ileitis including Crohn's disease; no evidence of  perforation or well-defined/drainable fluid collection.  -s/p IR drain 5/22, culture pending. Continue zosyn and antifungal until culture data complete - Advance to full liquids and as tolerated to soft diet. Add boost. Add probiotic - repeat CBC in AM -mobilize, pulm toilet -hopefully she will continue to improve with conservative management with c-scope in 6-8 weeks and further surgical plans if warranted at that time.   FEN - FLD ADAt to soft VTE - Lovenox ID - zosyn, micofungin  I reviewed hospitalist notes, last 24 h vitals and pain scores, last 48 h intake and output, last 24 h labs and trends (CBC, BMP)    LOS: 7 days    Wellington Hampshire, [facility] Surgery 01/16/2022, 9:00 AM Please see Amion for pager number during day hours 7:00am-4:30pm

## 2022-01-16 NOTE — Progress Notes (Signed)
Alexis Holmes [id number] 24-Oct-1950  CARE TEAM:  PCP: [place] [facility], [place]  Outpatient Care Team: Patient Care Team: [facility], [place] as PCP - General (Internal Medicine) Richmond Campbell, MD as Consulting Physician (Gastroenterology)  Inpatient Treatment Team: Treatment Team: Attending Provider: Bonnielee Haff, MD; Consulting Physician: Edison Pace, Md, MD; Rounding Team: Dorthy Cooler Radiology, MD; Rounding Team: Joycelyn Das, MD; Registered Nurse: Luanne Bras, RN; Technician: Wynema Birch, NT; Registered Nurse: Dorena Bodo, RN; Utilization Review: Tressie Stalker, RN   Problem List:   Principal Problem:   Ileitis, regional [facility]) Active Problems:   Arthritis   Vitamin D deficiency   Sepsis due to undetermined organism [facility])   History of colonic polyps   Periappendicitis   Nausea & vomiting      * No surgery found *      Assessment  Right lower quadrant inflammation and phlegmon with abscess developing into fistula  [facility] Stay = 7 days)  Plan:  -She seems more bloated with p.o. this morning but had some flatus and bowel movements.  Mixed picture.  Verbal reduced on clears for now.  Continue antibiotics.  Add antifungal coverage.  Micafungin x5 days.  Consider stopping once final cultures are in.  If markedly worsens, n.p.o. NG tube and TPN.  If creatinine improves try and readvanced diet.  At some point would benefit from colonoscopy and possible interval appendectomy versus ileocolectomy depending on findings and treatment.  If there is Crohn's disease definitely needs GI for evaluation and immunosuppression.  Drain is nonbilious which raises a high suspicion that there is a chronic leak somewhere in the ileal/appendiceal/colonic region.  Seems more thin which would favor ileal appendiceal.  We will see.  -VTE prophylaxis- SCDs, etc -mobilize as tolerated to help recovery  I updated the patient's status to the  patient and her  close friend Higinio Roger.   Recommendations were made.  Questions were answered.  They expressed understanding & appreciation.   Disposition:  Disposition:  The patient is from: Home  Anticipate discharge to:  Home with Home Health  Anticipated Date of Discharge is:  June 1,2023    Barriers to discharge:  Pending Clinical improvement (more likely than not)  Patient currently is NOT MEDICALLY STABLE for discharge from the hospital from a surgery standpoint.      I reviewed hospitalist notes, last 24 h vitals and pain scores, last 48 h intake and output, last 24 h labs and trends, and last 24 h imaging results. I have reviewed this patient's available data, including medical history, events of note, test results, etc as part of my evaluation.  A significant portion of that time was spent in counseling.  Care during the described time interval was provided by me.  This care required moderate level of medical decision making.  01/16/2022    Subjective: (Chief complaint)  Patient felt more bloated with liquids this morning.  However passing gas and had liquid bowel movement.  Pain less but feels tired.  Sitting on floor so far.  Objective:  Vital signs:  Vitals:   01/15/22 0514 01/15/22 1406 01/15/22 2057 01/16/22 0526  BP: 112/63 113/62 (!) 113/58 122/61  Pulse: 79 96 77 76  Resp: '17 18 18 18  '$ Temp: 98.2 F (36.8 C) 97.9 F (36.6 C) 98.9 F (37.2 C) 98.2 F (36.8 C)  TempSrc: Oral Oral Oral Oral  SpO2: 94% 95% 96% 92%  Weight:      Height:  Last BM Date : 01/15/22  Intake/Output   Yesterday:  05/24 0701 - 05/25 0700 In: 1055.3 [P.O.:240; IV Piggyback:805.3] Out: 955 [Urine:650; Drains:55; Stool:250] This shift:  No intake/output data recorded.  Bowel function:  Flatus: YES  BM:  YES  Drain:  Dark green output.   Physical Exam:  General: Pt awake/alert in mild acute distress Eyes: PERRL, normal EOM.  Sclera clear.  No icterus Neuro: CN  II-XII intact w/o focal sensory/motor deficits. Lymph: No head/neck/groin lymphadenopathy Psych:  No delerium/psychosis/paranoia.  Oriented x 4 HENT: Normocephalic, Mucus membranes moist.  No thrush Neck: Supple, No tracheal deviation.  No obvious thyromegaly Chest: No pain to chest wall compression.  Good respiratory excursion.  No audible wheezing CV:  Pulses intact.  Regular rhythm.  No major extremity edema MS: Normal AROM mjr joints.  No obvious deformity  Abdomen: Soft.  Moderately distended.  Tenderness at ring site only .  No evidence of peritonitis.  No incarcerated hernias.  Ext:   No deformity.  No mjr edema.  No cyanosis Skin: No petechiae / purpurea.  No major sores.  Warm and dry    Results:   Cultures: Recent Results (from the past 720 hour(s))  Urine Culture     Status: None   Collection Time: 01/09/22 11:09 AM   Specimen: Urine, Clean Catch  Result Value Ref Range Status   Specimen Description   Final    URINE, CLEAN CATCH Performed at [facility], [address] [place], [place] [address]    Special Requests   Final    NONE Performed at [facility], [address] [place], [place] [address]    Culture   Final    NO GROWTH Performed at [facility], [place] [address] [place], [place] [address]    Report Status 01/10/2022 FINAL  Final  Blood culture (routine x 2)     Status: Abnormal   Collection Time: 01/09/22 11:09 AM   Specimen: Left Antecubital; Blood  Result Value Ref Range Status   Specimen Description   Final    LEFT ANTECUBITAL Performed at [facility], [address] [place], [place] [address]    Special Requests   Final    BOTTLES DRAWN AEROBIC AND ANAEROBIC Blood Culture adequate volume Performed at [facility], [address] [place], [place] [address]    Culture  Setup Time   Final    GRAM NEGATIVE RODS ANAEROBIC BOTTLE ONLY CRITICAL RESULT CALLED TO, READ BACK BY AND VERIFIED  WITH: PHARMD ELLEN JACKSON 01/12/22'@2'$ :36 BY TW    Culture (A)  Final    BACTEROIDES THETAIOTAOMICRON BETA LACTAMASE POSITIVE Performed at [facility], [place] [address] [place], [place] [address]    Report Status 01/14/2022 FINAL  Final  Blood culture (routine x 2)     Status: None   Collection Time: 01/09/22 11:09 AM   Specimen: Right Antecubital; Blood  Result Value Ref Range Status   Specimen Description   Final    RIGHT ANTECUBITAL Performed at [facility], [address] [place], [place] [address]    Special Requests   Final    BOTTLES DRAWN AEROBIC AND ANAEROBIC Blood Culture adequate volume Performed at [facility], [address] [place], [place] [address]    Culture   Final    NO GROWTH 5 DAYS Performed at [facility], [place]  [address] [place], [place] [address]    Report Status 01/14/2022 FINAL  Final  Blood Culture ID Panel (Reflexed)     Status: None   Collection Time: 01/09/22 11:09 AM  Result Value Ref Range Status   Enterococcus faecalis NOT DETECTED NOT DETECTED Final   Enterococcus Faecium NOT DETECTED NOT DETECTED Final   Listeria monocytogenes NOT DETECTED NOT DETECTED Final   Staphylococcus species NOT DETECTED NOT DETECTED Final   Staphylococcus aureus (BCID) NOT DETECTED NOT DETECTED Final   Staphylococcus epidermidis NOT DETECTED NOT DETECTED Final   Staphylococcus lugdunensis NOT DETECTED NOT DETECTED Final   Streptococcus species NOT DETECTED NOT DETECTED Final   Streptococcus agalactiae NOT DETECTED NOT DETECTED Final   Streptococcus pneumoniae NOT DETECTED NOT DETECTED Final   Streptococcus pyogenes NOT DETECTED NOT DETECTED Final   A.calcoaceticus-baumannii NOT DETECTED NOT DETECTED Final   Bacteroides fragilis NOT DETECTED NOT DETECTED Final   Enterobacterales NOT DETECTED NOT DETECTED Final   Enterobacter cloacae complex NOT DETECTED NOT DETECTED Final   Escherichia coli NOT DETECTED NOT DETECTED Final    Klebsiella aerogenes NOT DETECTED NOT DETECTED Final   Klebsiella oxytoca NOT DETECTED NOT DETECTED Final   Klebsiella pneumoniae NOT DETECTED NOT DETECTED Final   Proteus species NOT DETECTED NOT DETECTED Final   Salmonella species NOT DETECTED NOT DETECTED Final   Serratia marcescens NOT DETECTED NOT DETECTED Final   Haemophilus influenzae NOT DETECTED NOT DETECTED Final   Neisseria meningitidis NOT DETECTED NOT DETECTED Final   Pseudomonas aeruginosa NOT DETECTED NOT DETECTED Final   Stenotrophomonas maltophilia NOT DETECTED NOT DETECTED Final   Candida albicans NOT DETECTED NOT DETECTED Final   Candida auris NOT DETECTED NOT DETECTED Final   Candida glabrata NOT DETECTED NOT DETECTED Final   Candida krusei NOT DETECTED NOT DETECTED Final   Candida parapsilosis NOT DETECTED NOT DETECTED Final   Candida tropicalis NOT DETECTED NOT DETECTED Final   Cryptococcus neoformans/gattii NOT DETECTED NOT DETECTED Final    Comment: Performed at [facility], [place] [address] [place], [place] [address]  MRSA Next Gen by PCR, Nasal     Status: None   Collection Time: 01/10/22  5:43 AM   Specimen: Nasal Mucosa; Nasal Swab  Result Value Ref Range Status   MRSA by PCR Next Gen NOT DETECTED NOT DETECTED Final    Comment: (NOTE) The GeneXpert MRSA Assay (FDA approved for NASAL specimens only), is one component of a comprehensive MRSA colonization surveillance program. It is not intended to diagnose MRSA infection nor to guide or monitor treatment for MRSA infections. Test performance is not FDA approved in patients less than 68 years old. Performed at [facility], [place] [address] [place], [place] [address]   C Difficile Quick Screen (NO PCR Reflex)     Status: None   Collection Time: 01/12/22  8:37 AM   Specimen: STOOL  Result Value Ref Range Status   C Diff antigen NEGATIVE NEGATIVE Final   C Diff toxin NEGATIVE NEGATIVE Final   C Diff interpretation No C. difficile  detected.  Final    Comment: Performed at [facility], [place] [address] [place], [place] [address]  Aerobic/Anaerobic Culture w Gram Stain (surgical/deep wound)     Status: None (Preliminary result)   Collection Time: 01/13/22  3:34 PM   Specimen: Abscess  Result Value Ref Range Status   Specimen Description   Final    ABSCESS Performed at [facility] [address]  [place], [place] [address]    Special Requests   Final    NONE Performed at [facility], [place] [address] [place], [place] [address]    Gram Stain   Final    MODERATE WBC PRESENT, PREDOMINANTLY PMN ABUNDANT GRAM POSITIVE COCCI FEW GRAM NEGATIVE RODS    Culture   Final    ABUNDANT STREPTOCOCCUS CONSTELLATUS CULTURE REINCUBATED FOR BETTER GROWTH Performed at [facility], [place] [address], [place], [place] [address]    Report Status PENDING  Incomplete    Labs: Results for orders placed or performed during the hospital encounter of 01/09/22 (from the past 48 hour(s))  CBC     Status: Abnormal   Collection Time: 01/15/22  5:24 AM  Result Value Ref Range   WBC 17.9 (H) 4.0 - 10.5 K/uL   RBC 3.34 (L) 3.87 - 5.11 MIL/uL   Hemoglobin 10.6 (L) 12.0 - 15.0 g/dL   HCT 31.0 (L) 36.0 - 46.0 %   MCV 92.8 80.0 - 100.0 fL   MCH 31.7 26.0 - 34.0 pg   MCHC 34.2 30.0 - 36.0 g/dL   RDW 13.3 11.5 - 15.5 %   Platelets 362 150 - 400 K/uL   nRBC 0.1 0.0 - 0.2 %    Comment: Performed at [facility], [place] [address] [place], [place] [address]  Basic metabolic panel     Status: Abnormal   Collection Time: 01/15/22  5:24 AM  Result Value Ref Range   Sodium 138 135 - 145 mmol/L   Potassium 2.8 (L) 3.5 - 5.1 mmol/L    Comment: DELTA CHECK NOTED   Chloride 99 98 - 111 mmol/L   CO2 29 22 - 32 mmol/L   Glucose, Bld 98 70 - 99 mg/dL    Comment: Glucose reference range applies only to samples taken after fasting for at least 8 hours.   BUN 17 8 - 23  mg/dL   Creatinine, Ser 1.10 (H) 0.44 - 1.00 mg/dL   Calcium 8.1 (L) 8.9 - 10.3 mg/dL   GFR, Estimated 53 (L) >60 mL/min    Comment: (NOTE) Calculated using the CKD-EPI Creatinine Equation (2021)    Anion gap 10 5 - 15    Comment: Performed at [facility], [place] [address], [place], [place] [address]  Magnesium     Status: None   Collection Time: 01/15/22  5:24 AM  Result Value Ref Range   Magnesium 1.8 1.7 - 2.4 mg/dL    Comment: Performed at [facility], [place] [address], [place], [place] [address]  CBC     Status: Abnormal   Collection Time: 01/16/22  5:41 AM  Result Value Ref Range   WBC 16.7 (H) 4.0 - 10.5 K/uL   RBC 3.56 (L) 3.87 - 5.11 MIL/uL   Hemoglobin 11.2 (L) 12.0 - 15.0 g/dL   HCT 33.2 (L) 36.0 - 46.0 %   MCV 93.3 80.0 - 100.0 fL   MCH 31.5 26.0 - 34.0 pg   MCHC 33.7 30.0 - 36.0 g/dL   RDW 13.0 11.5 - 15.5 %   Platelets 403 (H) 150 - 400 K/uL   nRBC 0.0 0.0 - 0.2 %    Comment: Performed at [facility], [place] [address], [place], [place] [address]  Basic metabolic panel     Status: Abnormal   Collection Time: 01/16/22  5:41 AM  Result Value Ref Range   Sodium 139 135 - 145 mmol/L  Potassium 3.9 3.5 - 5.1 mmol/L    Comment: DELTA CHECK NOTED NO VISIBLE HEMOLYSIS    Chloride 105 98 - 111 mmol/L   CO2 25 22 - 32 mmol/L   Glucose, Bld 85 70 - 99 mg/dL    Comment: Glucose reference range applies only to samples taken after fasting for at least 8 hours.   BUN 20 8 - 23 mg/dL   Creatinine, Ser 1.00 0.44 - 1.00 mg/dL   Calcium 8.3 (L) 8.9 - 10.3 mg/dL   GFR, Estimated 60 (L) >60 mL/min    Comment: (NOTE) Calculated using the CKD-EPI Creatinine Equation (2021)    Anion gap 9 5 - 15    Comment: Performed at [facility], [place] [address], [place], [place] [address]  Magnesium     Status: None   Collection Time: 01/16/22  5:41 AM  Result Value Ref Range   Magnesium 2.4 1.7 - 2.4 mg/dL     Comment: Performed at [facility], [place] [address] [place], [place] [address]    Imaging / Studies: No results found.  Medications / Allergies: per chart  Antibiotics: Anti-infectives (From admission, onward)    Start     Dose/Rate Route Frequency Ordered Stop   01/15/22 1000  micafungin (MYCAMINE) 100 mg in sodium chloride 0.9 % 100 mL IVPB       Note to Pharmacy: Pharmacy may adjust dosing strength, schedule, rate of infusion, etc as needed to optimize therapy   100 mg 110 mL/hr over 1 Hours Intravenous Every 24 hours 01/15/22 0842 01/20/22 0959   01/12/22 1600  piperacillin-tazobactam (ZOSYN) IVPB 3.375 g        3.375 g 12.5 mL/hr over 240 Minutes Intravenous Every 8 hours 01/12/22 1313     01/09/22 2330  ceFEPIme (MAXIPIME) 2 g in sodium chloride 0.9 % 100 mL IVPB  Status:  Discontinued        2 g 200 mL/hr over 30 Minutes Intravenous Every 12 hours 01/09/22 1133 01/12/22 1246   01/09/22 2200  metroNIDAZOLE (FLAGYL) IVPB 500 mg  Status:  Discontinued        500 mg 100 mL/hr over 60 Minutes Intravenous Every 12 hours 01/09/22 1402 01/12/22 1246   01/09/22 1915  metroNIDAZOLE (FLAGYL) IVPB 500 mg  Status:  Discontinued        500 mg 100 mL/hr over 60 Minutes Intravenous Every 8 hours 01/09/22 1826 01/09/22 1828   01/09/22 1115  ceFEPIme (MAXIPIME) 2 g in sodium chloride 0.9 % 100 mL IVPB        2 g 200 mL/hr over 30 Minutes Intravenous  Once 01/09/22 1109 01/09/22 1156   01/09/22 1115  metroNIDAZOLE (FLAGYL) IVPB 500 mg        500 mg 100 mL/hr over 60 Minutes Intravenous  Once 01/09/22 1109 01/09/22 1224         Note: Portions of this report may have been transcribed using voice recognition software. Every effort was made to ensure accuracy; however, inadvertent computerized transcription errors may be present.   Any transcriptional errors that result from this process are unintentional.    Adin Hector, MD, FACS, MASCRS Esophageal,  Gastrointestinal & Colorectal Surgery Robotic and Minimally Invasive Surgery  [facility], [place]  [place], [place], [place] [address] [phone] Fax [phone] Main  CONTACT INFORMATION:  Weekday (9AM-5PM): Call [facility] main office at [phone]  Weeknight (5PM-9AM) or Weekend/Holiday:  Check [email or web address] (password " [email or web address]") for General Surgery [facility] coverage  (Please, do not use SecureChat as it is not reliable communication to operating surgeons for immediate patient care)      01/16/2022  7:41 AM

## 2022-01-16 NOTE — Progress Notes (Signed)
[facility] PROGRESS NOTE    Progress Note  Alexis Holmes  BMW:[id number] DOB: 1950-12-26 DOA: 01/09/2022 PCP: Holland Commons, FNP     Brief Narrative:   Alexis Holmes is an 71 y.o. female past medical history of hyperlipidemia and previous nephrolithiasis comes in complaining of abdominal pain that started on Tuesday located in her lower quadrant it has progressively gotten worse to the point where she cannot move.  White blood cell count was 20,000 CT scan showed appendix severely inflamed with the cecum and small bowel.  .   Assessment/Plan:   Ruptured appendix versus terminal ileitis with intra-abdominal abscess/sepsis present on admission  General surgery was consulted.  CT scan showed multiple abscesses.  Interventional radiology was consulted and the patient underwent CT-guided drain placement.   Cultures from fluid growing STREPTOCOCCUS CONSTELLATUS .  Sensitivities are pending.  Patient remains on Zosyn.  Micafungin was added by general surgery yesterday.   WBC slightly better today.  Abdominal distention has improved.  She had loose stools yesterday.  Continue pain management.  She remains afebrile.   Further management per general surgery.  Hypokalemia Potassium has improved.  Magnesium is also improved at 2.4.   DVT prophylaxis: lovenox CODE STATUS: Full code Family Communication: Discussed with patient and her sister who was at bedside Disposition: Hopefully return home in improved  Status is: Inpatient Remains inpatient appropriate because: Sepsis due to regional enteritis.    IV Access:   Peripheral IV   Procedures and diagnostic studies:   No results found.   Medical Consultants:   General surgery   Subjective:   Patient mentions that she is feeling better in terms of her abdominal distention.  She has had multiple loose stools overnight.  Complains still of pain in the abdomen rating it at 8 out of 10.  Does not appear to be in any  discomfort.  Denies any shortness of breath or chest pain.    Objective:    Vitals:   01/15/22 0514 01/15/22 1406 01/15/22 2057 01/16/22 0526  BP: 112/63 113/62 (!) 113/58 122/61  Pulse: 79 96 77 76  Resp: '17 18 18 18  '$ Temp: 98.2 F (36.8 C) 97.9 F (36.6 C) 98.9 F (37.2 C) 98.2 F (36.8 C)  TempSrc: Oral Oral Oral Oral  SpO2: 94% 95% 96% 92%  Weight:      Height:       SpO2: 92 % O2 Flow Rate (L/min): 3 L/min   Intake/Output Summary (Last 24 hours) at 01/16/2022 0855 Last data filed at 01/16/2022 0524 Gross per 24 hour  Intake 1055.32 ml  Output 955 ml  Net 100.32 ml    Filed Weights   01/09/22 1025 01/09/22 1723  Weight: 61.2 kg 61.2 kg    Exam:  General appearance: Awake alert.  In no distress Resp: Clear to auscultation bilaterally.  Normal effort Cardio: S1-S2 is normal regular.  No S3-S4.  No rubs murmurs or bruit GI: Abdomen is softer today compared to yesterday.  Bowel sounds are present.  No masses organomegaly. Extremities: No edema.  Full range of motion of lower extremities. Neurologic: Alert and oriented x3.  No focal neurological deficits.        Data Reviewed:    Labs: Basic Metabolic Panel: Recent Labs  Lab 01/09/22 1035 01/10/22 0429 01/11/22 0249 01/11/22 0250 01/12/22 0529 01/14/22 0532 01/15/22 0524 01/16/22 0541  NA 134*   < >  --  140 140 139 138 139  K 3.8   < >  --  3.9 3.3* 3.5 2.8* 3.9  CL 97*   < >  --  111 113* 108 99 105  CO2 24   < >  --  19* 18* '22 29 25  '$ GLUCOSE 106*   < >  --  72 87 97 98 85  BUN 16   < >  --  '19 19 18 17 20  '$ CREATININE 1.36*   < >  --  1.11* 1.08* 1.08* 1.10* 1.00  CALCIUM 9.0   < >  --  7.2* 7.7* 7.5* 8.1* 8.3*  MG 2.2  --  2.0  --   --   --  1.8 2.4  PHOS 3.0  --   --  2.0*  --   --   --   --    < > = values in this interval not displayed.    GFR Estimated Creatinine Clearance: 42.7 mL/min (by C-G formula based on SCr of 1 mg/dL).  Liver Function Tests: Recent Labs  Lab  01/09/22 1035  AST 26  ALT 17  ALKPHOS 63  BILITOT 1.3*  PROT 7.5  ALBUMIN 4.1    Recent Labs  Lab 01/09/22 1035  LIPASE 37    Coagulation profile Recent Labs  Lab 01/13/22 1111  INR 1.1    CBC: Recent Labs  Lab 01/09/22 1035 01/10/22 0429 01/12/22 0529 01/13/22 0442 01/14/22 0532 01/15/22 0524 01/16/22 0541  WBC 20.4*   < > 16.7* 15.0* 17.5* 17.9* 16.7*  NEUTROABS 17.4*  --   --   --   --   --   --   HGB 12.9   < > 10.6* 10.1* 9.9* 10.6* 11.2*  HCT 38.4   < > 31.4* 30.0* 29.3* 31.0* 33.2*  MCV 93.2   < > 93.7 93.5 94.2 92.8 93.3  PLT 315   < > 334 327 281 362 403*   < > = values in this interval not displayed.    Sepsis Labs: Recent Labs  Lab 01/09/22 1109 01/09/22 1251 01/09/22 1747 01/10/22 0429 01/13/22 0442 01/14/22 0532 01/15/22 0524 01/16/22 0541  WBC  --   --   --    < > 15.0* 17.5* 17.9* 16.7*  LATICACIDVEN 1.9 2.8* 2.2*  --   --   --   --   --    < > = values in this interval not displayed.    Microbiology Recent Results (from the past 240 hour(s))  Urine Culture     Status: None   Collection Time: 01/09/22 11:09 AM   Specimen: Urine, Clean Catch  Result Value Ref Range Status   Specimen Description   Final    URINE, CLEAN CATCH Performed at [facility], [address] [place], [place] [address]    Special Requests   Final    NONE Performed at [facility], [address] [place], [place] [address]    Culture   Final    NO GROWTH Performed at [facility], [place] [address], [place], [place] [address]    Report Status 01/10/2022 FINAL  Final  Blood culture (routine x 2)     Status: Abnormal   Collection Time: 01/09/22 11:09 AM   Specimen: Left Antecubital; Blood  Result Value Ref Range Status   Specimen Description   Final    LEFT ANTECUBITAL Performed at [facility], [address] [place], [place] [address]    Special Requests   Final  BOTTLES DRAWN AEROBIC AND ANAEROBIC Blood  Culture adequate volume Performed at [facility], [address] [place], [place] [address]    Culture  Setup Time   Final    GRAM NEGATIVE RODS ANAEROBIC BOTTLE ONLY CRITICAL RESULT CALLED TO, READ BACK BY AND VERIFIED WITH: PHARMD ELLEN JACKSON 01/12/22'@2'$ :36 BY TW    Culture (A)  Final    BACTEROIDES THETAIOTAOMICRON BETA LACTAMASE POSITIVE Performed at [facility], [place] [address] [place], [place] [address]    Report Status 01/14/2022 FINAL  Final  Blood culture (routine x 2)     Status: None   Collection Time: 01/09/22 11:09 AM   Specimen: Right Antecubital; Blood  Result Value Ref Range Status   Specimen Description   Final    RIGHT ANTECUBITAL Performed at [facility], [address] [place], [place] [address]    Special Requests   Final    BOTTLES DRAWN AEROBIC AND ANAEROBIC Blood Culture adequate volume Performed at [facility], [address] [place], [place] [address]    Culture   Final    NO GROWTH 5 DAYS Performed at [facility], [place] [address] [place], [place] [address]    Report Status 01/14/2022 FINAL  Final  Blood Culture ID Panel (Reflexed)     Status: None   Collection Time: 01/09/22 11:09 AM  Result Value Ref Range Status   Enterococcus faecalis NOT DETECTED NOT DETECTED Final   Enterococcus Faecium NOT DETECTED NOT DETECTED Final   Listeria monocytogenes NOT DETECTED NOT DETECTED Final   Staphylococcus species NOT DETECTED NOT DETECTED Final   Staphylococcus aureus (BCID) NOT DETECTED NOT DETECTED Final   Staphylococcus epidermidis NOT DETECTED NOT DETECTED Final   Staphylococcus lugdunensis NOT DETECTED NOT DETECTED Final   Streptococcus species NOT DETECTED NOT DETECTED Final   Streptococcus agalactiae NOT DETECTED NOT DETECTED Final   Streptococcus pneumoniae NOT DETECTED NOT DETECTED Final   Streptococcus pyogenes NOT DETECTED NOT DETECTED Final   A.calcoaceticus-baumannii NOT DETECTED NOT  DETECTED Final   Bacteroides fragilis NOT DETECTED NOT DETECTED Final   Enterobacterales NOT DETECTED NOT DETECTED Final   Enterobacter cloacae complex NOT DETECTED NOT DETECTED Final   Escherichia coli NOT DETECTED NOT DETECTED Final   Klebsiella aerogenes NOT DETECTED NOT DETECTED Final   Klebsiella oxytoca NOT DETECTED NOT DETECTED Final   Klebsiella pneumoniae NOT DETECTED NOT DETECTED Final   Proteus species NOT DETECTED NOT DETECTED Final   Salmonella species NOT DETECTED NOT DETECTED Final   Serratia marcescens NOT DETECTED NOT DETECTED Final   Haemophilus influenzae NOT DETECTED NOT DETECTED Final   Neisseria meningitidis NOT DETECTED NOT DETECTED Final   Pseudomonas aeruginosa NOT DETECTED NOT DETECTED Final   Stenotrophomonas maltophilia NOT DETECTED NOT DETECTED Final   Candida albicans NOT DETECTED NOT DETECTED Final   Candida auris NOT DETECTED NOT DETECTED Final   Candida glabrata NOT DETECTED NOT DETECTED Final   Candida krusei NOT DETECTED NOT DETECTED Final   Candida parapsilosis NOT DETECTED NOT DETECTED Final   Candida tropicalis NOT DETECTED NOT DETECTED Final   Cryptococcus neoformans/gattii NOT DETECTED NOT DETECTED Final    Comment: Performed at [facility], [place] [address], [place], [place] [address]  MRSA Next Gen by PCR, Nasal     Status: None   Collection Time: 01/10/22  5:43 AM   Specimen: Nasal Mucosa; Nasal Swab  Result Value Ref Range Status   MRSA  by PCR Next Gen NOT DETECTED NOT DETECTED Final    Comment: (NOTE) The GeneXpert MRSA Assay (FDA approved for NASAL specimens only), is one component of a comprehensive MRSA colonization surveillance program. It is not intended to diagnose MRSA infection nor to guide or monitor treatment for MRSA infections. Test performance is not FDA approved in patients less than 60 years old. Performed at [facility], [place] [address], [place], [place] [address]   C Difficile Quick Screen (NO  PCR Reflex)     Status: None   Collection Time: 01/12/22  8:37 AM   Specimen: STOOL  Result Value Ref Range Status   C Diff antigen NEGATIVE NEGATIVE Final   C Diff toxin NEGATIVE NEGATIVE Final   C Diff interpretation No C. difficile detected.  Final    Comment: Performed at [facility], [place] [address], [place], [place] [address]  Aerobic/Anaerobic Culture w Gram Stain (surgical/deep wound)     Status: None (Preliminary result)   Collection Time: 01/13/22  3:34 PM   Specimen: Abscess  Result Value Ref Range Status   Specimen Description   Final    ABSCESS Performed at [place] [address], [place], [place] [address]    Special Requests   Final    NONE Performed at [facility], [place] [address], [place], [place] [address]    Gram Stain   Final    MODERATE WBC PRESENT, PREDOMINANTLY PMN ABUNDANT GRAM POSITIVE COCCI FEW GRAM NEGATIVE RODS    Culture   Final    ABUNDANT STREPTOCOCCUS CONSTELLATUS CULTURE REINCUBATED FOR BETTER GROWTH Performed at [facility], [place] [address] [place], [place] [address]    Report Status PENDING  Incomplete     Medications:    Chlorhexidine Gluconate Cloth  6 each Topical Daily   enoxaparin (LOVENOX) injection  40 mg Subcutaneous Q24H   feeding supplement  1 Container Oral TID BM   lip balm  1 application. Topical BID   mouth rinse  15 mL Mouth Rinse BID   meperidine (DEMEROL) injection  25 mg Intravenous Once   saccharomyces boulardii  250 mg Oral BID   sodium chloride flush  3 mL Intravenous Q12H   sodium chloride flush  5 mL Intracatheter Q8H   Continuous Infusions:  sodium chloride     sodium chloride 10 mL/hr at 01/15/22 0417   methocarbamol (ROBAXIN) IV     micafungin (MYCAMINE) IV Stopped (01/15/22 1244)   ondansetron (ZOFRAN) IV Stopped (01/11/22 0243)   piperacillin-tazobactam (ZOSYN)  IV 3.375 g (01/16/22 0756)      LOS: 7 days   [employer]  [facility]  01/16/2022, 8:55 AM

## 2022-01-17 DIAGNOSIS — K37 Unspecified appendicitis: Secondary | ICD-10-CM | POA: Diagnosis not present

## 2022-01-17 DIAGNOSIS — E876 Hypokalemia: Secondary | ICD-10-CM

## 2022-01-17 LAB — CBC
HCT: 31.8 % — ABNORMAL LOW (ref 36.0–46.0)
Hemoglobin: 10.4 g/dL — ABNORMAL LOW (ref 12.0–15.0)
MCH: 30.6 pg (ref 26.0–34.0)
MCHC: 32.7 g/dL (ref 30.0–36.0)
MCV: 93.5 fL (ref 80.0–100.0)
Platelets: 429 10*3/uL — ABNORMAL HIGH (ref 150–400)
RBC: 3.4 MIL/uL — ABNORMAL LOW (ref 3.87–5.11)
RDW: 13 % (ref 11.5–15.5)
WBC: 12.5 10*3/uL — ABNORMAL HIGH (ref 4.0–10.5)
nRBC: 0 % (ref 0.0–0.2)

## 2022-01-17 LAB — BASIC METABOLIC PANEL
Anion gap: 6 (ref 5–15)
BUN: 16 mg/dL (ref 8–23)
CO2: 25 mmol/L (ref 22–32)
Calcium: 8.3 mg/dL — ABNORMAL LOW (ref 8.9–10.3)
Chloride: 109 mmol/L (ref 98–111)
Creatinine, Ser: 0.93 mg/dL (ref 0.44–1.00)
GFR, Estimated: >60 mL/min (ref >60)
Glucose, Bld: 98 mg/dL (ref 70–99)
Potassium: 3.6 mmol/L (ref 3.5–5.1)
Sodium: 140 mmol/L (ref 135–145)

## 2022-01-17 LAB — AEROBIC/ANAEROBIC CULTURE W GRAM STAIN (SURGICAL/DEEP WOUND)

## 2022-01-17 MED ORDER — AMOXICILLIN-POT CLAVULANATE 875-125 MG PO TABS: 1.0000 | ORAL_TABLET | Freq: Two times a day (BID) | ORAL | 0 refills | Status: AC

## 2022-01-17 MED ORDER — POTASSIUM CHLORIDE CRYS ER 20 MEQ PO TBCR
40.0000 meq | EXTENDED_RELEASE_TABLET | Freq: Once | ORAL | Status: AC
  Administered 2022-01-17: 40 meq via ORAL
  Filled 2022-01-17: qty 2

## 2022-01-17 MED ORDER — OXYCODONE HCL 5 MG PO TABS: 5.0000 mg | ORAL_TABLET | ORAL | Status: DC | PRN

## 2022-01-17 MED ORDER — OXYCODONE HCL 5 MG PO TABS: 5.0000 mg | ORAL_TABLET | Freq: Four times a day (QID) | ORAL | 0 refills | Status: DC | PRN

## 2022-01-17 MED ORDER — SACCHAROMYCES BOULARDII 250 MG PO CAPS: 250.0000 mg | ORAL_CAPSULE | Freq: Two times a day (BID) | ORAL | 0 refills | Status: AC

## 2022-01-17 NOTE — Progress Notes (Signed)
Patient ID: Alexis Holmes, female   DOB: August 09, 1951, 71 y.o.   MRN: [id number]    Referring Physician(s): Gross,S  Supervising Physician: Daryll Brod  Patient Status:  [facility] - In-pt  Chief Complaint:  Pelvic fluid collection/abscess  Subjective: Pt being dc'd home today with pelvic drain; no new c/o   Allergies: Patient has no known allergies.  Medications: Prior to Admission medications   Medication Sig Start Date End Date Taking? Authorizing Provider  alendronate (FOSAMAX) 70 MG tablet Take 70 mg by mouth once a week. 02/26/21  Yes [provider]  amoxicillin-clavulanate (AUGMENTIN) 875-125 MG tablet Take 1 tablet by mouth 2 (two) times daily for 10 days. 01/17/22 01/27/22 Yes Meuth, Blaine Hamper, PA-C  aspirin 81 MG chewable tablet Chew 81 mg by mouth daily.   Yes [provider]  B Complex Vitamins (B COMPLEX 100 PO) Take 1 tablet by mouth daily.   Yes [provider]  Biotin 1 MG CAPS Take 1 mg by mouth daily.   Yes [provider]  Calcium-Phosphorus-Vitamin D (CITRACAL +D3 PO) Take 1 tablet by mouth daily.   Yes [provider]  Cholecalciferol (VITAMIN D) 125 MCG (5000 UT) CAPS Take 5,000 Units by mouth daily.   Yes [provider]  DULoxetine (CYMBALTA) 20 MG capsule Take 20 mg by mouth daily.   Yes [provider]  ibuprofen (ADVIL) 200 MG tablet Take 200-400 mg by mouth every 6 (six) hours as needed for moderate pain.   Yes [provider]  Lecithin 1200 MG CAPS Take 1,200 mg by mouth daily.   Yes [provider]  Lutein 40 MG CAPS Take 40 mg by mouth daily.   Yes [provider]  magnesium 30 MG tablet Take 30 mg by mouth daily.   Yes [provider]  magnesium hydroxide (MILK OF MAGNESIA) 400 MG/5ML suspension Take 30 mLs by mouth daily as needed for mild constipation.   Yes [provider]  Omega-3 Fatty Acids (FISH OIL) 1000 MG CAPS Take 1,000 mg by mouth daily.    Yes [provider]  psyllium (METAMUCIL) 58.6 % powder Take 1 packet by mouth daily as needed (constipation).   Yes [provider]  Turmeric 500 MG CAPS Take 500 mg by mouth daily.   Yes [provider]  vitamin B-12 (CYANOCOBALAMIN) 1000 MCG tablet Take 1,000 mcg by mouth daily.   Yes [provider]  vitamin C (ASCORBIC ACID) 500 MG tablet Take 500 mg by mouth daily.   Yes [provider]  cyclobenzaprine (FLEXERIL) 5 MG tablet Take 1 tablet (5 mg total) by mouth at bedtime. Patient not taking: Reported on 01/09/2022 12/02/21   Raylene Everts, MD  naproxen sodium (ANAPROX DS) 550 MG tablet Take 1 tablet (550 mg total) by mouth 2 (two) times daily with a meal. Patient not taking: Reported on 01/09/2022 12/02/21   Raylene Everts, MD  oxyCODONE (OXY IR/ROXICODONE) 5 MG immediate release tablet Take 1 tablet (5 mg total) by mouth every 6 (six) hours as needed for severe pain. 01/17/22   Meuth, Blaine Hamper, PA-C  saccharomyces boulardii (FLORASTOR) 250 MG capsule Take 1 capsule (250 mg total) by mouth 2 (two) times daily for 14 days. 01/17/22 01/31/22  Bonnielee Haff, MD     Vital Signs: BP 121/60 (BP Location: Left Arm)   Pulse 73   Temp 98.6 F (37 C) (Oral)   Resp 18   Ht '5\' 1"'$  (1.549 m)  Wt 135 lb (61.2 kg)   SpO2 94%   BMI 25.51 kg/m   Physical Exam awake/alert; rt TG/pelvic drain intact, dressing clean and dry, mildly tender, OP 20 cc bilious fluid; drain flushed without difficulty -minimal return  Imaging: DG CHEST PORT 1 VIEW  Result Date: 01/13/2022 CLINICAL DATA:  Dyspnea. Drainage of pelvic fluid collection this afternoon with subsequent episode of bacteremia. Rigors, tachycardia, tachypnea. EXAM: PORTABLE CHEST 1 VIEW COMPARISON:  AP chest 01/11/2022, 01/09/2022 FINDINGS: Cardiac silhouette and mediastinal contours are within normal limits. There are again low lung volumes. Elevation of the right hemidiaphragm is similar to prior.  Small bilateral pleural effusions. No pneumothorax. No acute skeletal abnormality. IMPRESSION: Decreased lung volumes and small bilateral pleural effusions with bibasilar atelectasis, similar to prior. Electronically Signed   By: Yvonne Kendall M.D.   On: 01/13/2022 17:45   CT IMAGE GUIDED DRAINAGE BY PERCUTANEOUS CATHETER  Result Date: 01/15/2022 INDICATION: Patient has a small 4 x 2.3 cm loculated abscess at the right lower pelvis requiring drain insertion EXAM: CT-guided 10 French drainage catheter insertion into the right lower pelvic absence. TECHNIQUE: Multidetector CT imaging of the was performed following the standard protocol with/without IV contrast. RADIATION DOSE REDUCTION: This exam was performed according to the departmental dose-optimization program which includes automated exposure control, adjustment of the mA and/or kV according to patient size and/or use of iterative reconstruction technique. MEDICATIONS: The patient is currently admitted to the hospital and receiving intravenous antibiotics. The antibiotics were administered within an appropriate time frame prior to the initiation of the procedure. ANESTHESIA/SEDATION: Moderate (conscious) sedation was employed during this procedure. A total of Versed 2.0 mg and Fentanyl 100 mcg was administered intravenously by the radiology nurse. Total intra-service moderate Sedation Time: 30 minutes. The patient's level of consciousness and vital signs were monitored continuously by radiology nursing throughout the procedure under my direct supervision. COMPLICATIONS: None immediate. PROCEDURE: Informed written consent was obtained from the patient after a thorough discussion of the procedural risks, benefits and alternatives. All questions were addressed. Maximal Sterile Barrier Technique was utilized including caps, mask, sterile gowns, sterile gloves, sterile drape, hand hygiene and skin antiseptic. A timeout was performed prior to the initiation of the  procedure. Patient was placed prone on the CT table and selected CT imaging of the abscess area was performed. Skin corresponding to the abscess pocket was marked, prepped, draped and anesthetized with 1% xylocaine with the needle to be inserted by the right posterolateral transgluteal approach. Under CT guidance, 18 gauge trocar needle was inserted into the abscess pocket. The inner stylet was removed, 035 inch Amplatz wire was introduced, access site was dilated and a 10 French drainage catheter was inserted to form the Cope loop within the abscess pocket. Approximately 10 mL of thin foul-smelling pus was drained. Catheter was sutured to the skin using 2-0 silk and was connected to a JP bulb. Sterile dressing was placed. The specimen was sent to the lab for culture and sensitivity. Patient tolerated the procedure well. IMPRESSION: CT-guided drain insertion into the right lower pelvic abscess. Electronically Signed   By: Frazier Richards M.D.   On: 01/15/2022 11:27    Labs:  CBC: Recent Labs    01/14/22 0532 01/15/22 0524 01/16/22 0541 01/17/22 0702  WBC 17.5* 17.9* 16.7* 12.5*  HGB 9.9* 10.6* 11.2* 10.4*  HCT 29.3* 31.0* 33.2* 31.8*  PLT 281 362 403* 429*    COAGS: Recent Labs    01/13/22 1111  INR 1.1  BMP: Recent Labs    01/14/22 0532 01/15/22 0524 01/16/22 0541 01/17/22 0702  NA 139 138 139 140  K 3.5 2.8* 3.9 3.6  CL 108 99 105 109  CO2 '22 29 25 25  '$ GLUCOSE 97 98 85 98  BUN '18 17 20 16  '$ CALCIUM 7.5* 8.1* 8.3* 8.3*  CREATININE 1.08* 1.10* 1.00 0.93  GFRNONAA 55* 53* 60* >60    LIVER FUNCTION TESTS: Recent Labs    01/09/22 1035  BILITOT 1.3*  AST 26  ALT 17  ALKPHOS 41  PROT 7.5  ALBUMIN 4.1    Assessment and Plan: 71 y.o. female with past medical history of arthritis, colon polyps, hypercholesterolemia, nephrolithiasis ,vitamin D deficiency who was admitted to [facility] on 01/09/2022 with worsening lower abdominal pain and diminished appetite.  In  the ED patient was noted to be febrile with leukocytosis of 20,000 lactic acid of 2.8 and CT revealing severely inflamed appendix/cecum/small bowel likely secondary to adjacent acute appendicitis; s/p pelvic fluid collection drainage 5/22 (10 fr to JP); afebrile;  WBC 12.5(16.7), hgb 10.4(11.2), creat nl; drain cx- strept constellatus; bilious output remains; as OP rec once daily flush of drain with 5 cc sterile saline, output recording and dressing change every 2-3 days; pt will be scheduled for f/u in IR clinic in 10-14 days; prescription for saline flushes given to pt, drain care/flush technique reviewed with pt   Electronically Signed: D. Rowe [place], PA-C 01/17/2022, 12:37 PM   I spent a total of 15 Minutes at the the patient's bedside AND on the patient's hospital floor or unit, greater than 50% of which was counseling/coordinating care for pelvic abscess drain

## 2022-01-17 NOTE — Progress Notes (Signed)
[facility] PROGRESS NOTE    Progress Note  Alexis Holmes  TKZ:[id number] DOB: [birth year] DOA: 01/09/2022 PCP: Holland Commons, FNP     Brief Narrative:   Alexis Holmes is an 71 y.o. female past medical history of hyperlipidemia and previous nephrolithiasis comes in complaining of abdominal pain that started on Tuesday located in her lower quadrant it has progressively gotten worse to the point where she cannot move.  White blood cell count was 20,000 CT scan showed appendix severely inflamed with the cecum and small bowel.  .   Assessment/Plan:   Ruptured appendix versus terminal ileitis with intra-abdominal abscess/sepsis present on admission  General surgery was consulted.  CT scan showed multiple abscesses.  Interventional radiology was consulted and the patient underwent CT-guided drain placement.   Cultures from fluid growing STREPTOCOCCUS CONSTELLATUS .  Sensitivities are pending.   Patient remains on Zosyn.  Micafungin was added by general surgery on 5/24.   WBC has improved to 12.5 today.  Continues to have on and off pain in the abdomen but feels better for the most part.  No bowel movement since yesterday morning.  Passing some gas. Further management per general surgery.  Hypokalemia Potassium has improved.  Magnesium was 2.4 as of yesterday.  We will give additional dose of potassium today.   DVT prophylaxis: lovenox CODE STATUS: Full code Family Communication: Discussed with patient and her sister who was at bedside Disposition: Hopefully return home when improved.  Continue to ambulate in the hallway.  Status is: Inpatient Remains inpatient appropriate because: Sepsis due to regional enteritis.    IV Access:   Peripheral IV   Procedures and diagnostic studies:   No results found.   Medical Consultants:   General surgery   Subjective:   Patient mentions that she is still feeling well.  Abdominal distention has subsided.  Last bowel  movement was yesterday morning.  Pain on and off but not as bad as yesterday.    Objective:    Vitals:   01/16/22 0526 01/16/22 1408 01/16/22 2054 01/17/22 0518  BP: 122/61 119/65 120/60 121/60  Pulse: 76 80 82 73  Resp: '18  18 18  '$ Temp: 98.2 F (36.8 C) 98.3 F (36.8 C) 99 F (37.2 C) 98.6 F (37 C)  TempSrc: Oral Oral Oral Oral  SpO2: 92% 97% 94% 94%  Weight:      Height:       SpO2: 94 % O2 Flow Rate (L/min): 3 L/min   Intake/Output Summary (Last 24 hours) at 01/17/2022 0921 Last data filed at 01/17/2022 0841 Gross per 24 hour  Intake 738 ml  Output 620 ml  Net 118 ml    Filed Weights   01/09/22 1025 01/09/22 1723  Weight: 61.2 kg 61.2 kg    Exam:  General appearance: Awake alert.  In no distress Resp: Clear to auscultation bilaterally.  Normal effort Cardio: S1-S2 is normal regular.  No S3-S4.  No rubs murmurs or bruit GI: Abdomen is soft.  Mildly tender in the right lower quadrant without any rebound rigidity or guarding.  No masses organomegaly. Extremities: No edema.  Full range of motion of lower extremities. Neurologic: Alert and oriented x3.  No focal neurological deficits.       Data Reviewed:    Labs: Basic Metabolic Panel: Recent Labs  Lab 01/11/22 0249 01/11/22 0250 01/11/22 0250 01/12/22 5732 01/14/22 0532 01/15/22 2025 01/16/22 0541 01/17/22 0702  NA  --  140   < > 140 139 138  139 140  K  --  3.9   < > 3.3* 3.5 2.8* 3.9 3.6  CL  --  111   < > 113* 108 99 105 109  CO2  --  19*   < > 18* '22 29 25 25  '$ GLUCOSE  --  72   < > 87 97 98 85 98  BUN  --  19   < > '19 18 17 20 16  '$ CREATININE  --  1.11*   < > 1.08* 1.08* 1.10* 1.00 0.93  CALCIUM  --  7.2*   < > 7.7* 7.5* 8.1* 8.3* 8.3*  MG 2.0  --   --   --   --  1.8 2.4  --   PHOS  --  2.0*  --   --   --   --   --   --    < > = values in this interval not displayed.    GFR Estimated Creatinine Clearance: 45.9 mL/min (by C-G formula based on SCr of 0.93 mg/dL).   Coagulation  profile Recent Labs  Lab 01/13/22 1111  INR 1.1    CBC: Recent Labs  Lab 01/13/22 0442 01/14/22 0532 01/15/22 0524 01/16/22 0541 01/17/22 0702  WBC 15.0* 17.5* 17.9* 16.7* 12.5*  HGB 10.1* 9.9* 10.6* 11.2* 10.4*  HCT 30.0* 29.3* 31.0* 33.2* 31.8*  MCV 93.5 94.2 92.8 93.3 93.5  PLT 327 281 362 403* 429*    Sepsis Labs: Recent Labs  Lab 01/14/22 0532 01/15/22 0524 01/16/22 0541 01/17/22 0702  WBC 17.5* 17.9* 16.7* 12.5*    Microbiology Recent Results (from the past 240 hour(s))  Urine Culture     Status: None   Collection Time: 01/09/22 11:09 AM   Specimen: Urine, Clean Catch  Result Value Ref Range Status   Specimen Description   Final    URINE, CLEAN CATCH Performed at [facility], [address] [place], [place] [address]    Special Requests   Final    NONE Performed at [facility], [address] [place], [place] [address]    Culture   Final    NO GROWTH Performed at [facility], [place] [address] [place], [place] [address]    Report Status 01/10/2022 FINAL  Final  Blood culture (routine x 2)     Status: Abnormal   Collection Time: 01/09/22 11:09 AM   Specimen: Left Antecubital; Blood  Result Value Ref Range Status   Specimen Description   Final    LEFT ANTECUBITAL Performed at [facility], [address] [place], [place] [address]    Special Requests   Final    BOTTLES DRAWN AEROBIC AND ANAEROBIC Blood Culture adequate volume Performed at [facility], [address] [place], [place] [address]    Culture  Setup Time   Final    GRAM NEGATIVE RODS ANAEROBIC BOTTLE ONLY CRITICAL RESULT CALLED TO, READ BACK BY AND VERIFIED WITH: PHARMD ELLEN JACKSON 01/12/22'@2'$ :36 BY TW    Culture (A)  Final    BACTEROIDES THETAIOTAOMICRON BETA LACTAMASE POSITIVE Performed at [facility], [place] [address] [place], [place] [address]    Report Status 01/14/2022 FINAL  Final  Blood culture (routine x 2)      Status: None   Collection Time: 01/09/22 11:09 AM   Specimen: Right Antecubital; Blood  Result Value Ref Range Status   Specimen Description   Final  RIGHT ANTECUBITAL Performed at [facility], [address] [place], [place] [address]    Special Requests   Final    BOTTLES DRAWN AEROBIC AND ANAEROBIC Blood Culture adequate volume Performed at [facility], [address] [place], [place] [address]    Culture   Final    NO GROWTH 5 DAYS Performed at [facility], [place] [address], [place], [place] [address]    Report Status 01/14/2022 FINAL  Final  Blood Culture ID Panel (Reflexed)     Status: None   Collection Time: 01/09/22 11:09 AM  Result Value Ref Range Status   Enterococcus faecalis NOT DETECTED NOT DETECTED Final   Enterococcus Faecium NOT DETECTED NOT DETECTED Final   Listeria monocytogenes NOT DETECTED NOT DETECTED Final   Staphylococcus species NOT DETECTED NOT DETECTED Final   Staphylococcus aureus (BCID) NOT DETECTED NOT DETECTED Final   Staphylococcus epidermidis NOT DETECTED NOT DETECTED Final   Staphylococcus lugdunensis NOT DETECTED NOT DETECTED Final   Streptococcus species NOT DETECTED NOT DETECTED Final   Streptococcus agalactiae NOT DETECTED NOT DETECTED Final   Streptococcus pneumoniae NOT DETECTED NOT DETECTED Final   Streptococcus pyogenes NOT DETECTED NOT DETECTED Final   A.calcoaceticus-baumannii NOT DETECTED NOT DETECTED Final   Bacteroides fragilis NOT DETECTED NOT DETECTED Final   Enterobacterales NOT DETECTED NOT DETECTED Final   Enterobacter cloacae complex NOT DETECTED NOT DETECTED Final   Escherichia coli NOT DETECTED NOT DETECTED Final   Klebsiella aerogenes NOT DETECTED NOT DETECTED Final   Klebsiella oxytoca NOT DETECTED NOT DETECTED Final   Klebsiella pneumoniae NOT DETECTED NOT DETECTED Final   Proteus species NOT DETECTED NOT DETECTED Final   Salmonella species NOT DETECTED NOT DETECTED Final   Serratia  marcescens NOT DETECTED NOT DETECTED Final   Haemophilus influenzae NOT DETECTED NOT DETECTED Final   Neisseria meningitidis NOT DETECTED NOT DETECTED Final   Pseudomonas aeruginosa NOT DETECTED NOT DETECTED Final   Stenotrophomonas maltophilia NOT DETECTED NOT DETECTED Final   Candida albicans NOT DETECTED NOT DETECTED Final   Candida auris NOT DETECTED NOT DETECTED Final   Candida glabrata NOT DETECTED NOT DETECTED Final   Candida krusei NOT DETECTED NOT DETECTED Final   Candida parapsilosis NOT DETECTED NOT DETECTED Final   Candida tropicalis NOT DETECTED NOT DETECTED Final   Cryptococcus neoformans/gattii NOT DETECTED NOT DETECTED Final    Comment: Performed at [facility], [place] [address], [place], [place] [address]  MRSA Next Gen by PCR, Nasal     Status: None   Collection Time: 01/10/22  5:43 AM   Specimen: Nasal Mucosa; Nasal Swab  Result Value Ref Range Status   MRSA by PCR Next Gen NOT DETECTED NOT DETECTED Final    Comment: (NOTE) The GeneXpert MRSA Assay (FDA approved for NASAL specimens only), is one component of a comprehensive MRSA colonization surveillance program. It is not intended to diagnose MRSA infection nor to guide or monitor treatment for MRSA infections. Test performance is not FDA approved in patients less than 90 years old. Performed at [facility], [place] [address], [place], [place] [address]   C Difficile Quick Screen (NO PCR Reflex)     Status: None   Collection Time: 01/12/22  8:37 AM   Specimen: STOOL  Result Value Ref Range Status   C Diff antigen NEGATIVE NEGATIVE Final   C Diff toxin NEGATIVE NEGATIVE Final   C Diff interpretation No C. difficile detected.  Final  Comment: Performed at [facility], [place] [address], [place], [place] [address]  Aerobic/Anaerobic Culture w Gram Stain (surgical/deep wound)     Status: None (Preliminary result)   Collection Time: 01/13/22  3:34 PM   Specimen: Abscess   Result Value Ref Range Status   Specimen Description   Final    ABSCESS Performed at [place] [address] [place], [place] [address]    Special Requests   Final    NONE Performed at [facility], [place] [address], [place], [place] [address]    Gram Stain   Final    MODERATE WBC PRESENT, PREDOMINANTLY PMN ABUNDANT GRAM POSITIVE COCCI FEW GRAM NEGATIVE RODS    Culture   Final    ABUNDANT STREPTOCOCCUS CONSTELLATUS HOLDING FOR POSSIBLE ANAEROBE Performed at [facility], [place] [address] [place], [place] [address]    Report Status PENDING  Incomplete     Medications:    Chlorhexidine Gluconate Cloth  6 each Topical Daily   enoxaparin (LOVENOX) injection  40 mg Subcutaneous Q24H   feeding supplement  1 Container Oral TID BM   lip balm  1 application. Topical BID   mouth rinse  15 mL Mouth Rinse BID   saccharomyces boulardii  250 mg Oral BID   sodium chloride flush  3 mL Intravenous Q12H   sodium chloride flush  5 mL Intracatheter Q8H   Continuous Infusions:  sodium chloride     sodium chloride 10 mL/hr at 01/15/22 0417   methocarbamol (ROBAXIN) IV     micafungin [facility]) IV 100 mg (01/16/22 1227)   ondansetron (ZOFRAN) IV Stopped (01/11/22 0243)   piperacillin-tazobactam (ZOSYN)  IV 3.375 g (01/17/22 0840)      LOS: 8 days   [employer]  [facility]  01/17/2022, 9:21 AM

## 2022-01-17 NOTE — Care Management Important Message (Signed)
Important Message  Patient Details IM Letter given to the Patient. Name: Alexis Holmes MRN: [id number] Date of Birth: Sep 28, 1950   Medicare Important Message Given:  Yes     Kerin Salen 01/17/2022, 9:55 AM

## 2022-01-17 NOTE — Progress Notes (Signed)
AVS given to [name] and Higinio Roger. Discharge instructions and medicaitons reviewed. All questions answered. Education given on how to flush drain given by K. Allred, PA. Nursing reinforced education and informed how to change drain dressing. Leeroy Bock and Judson Roch are both satisfied with education and DC instructions. Patient walked off floor with Judson Roch to car.

## 2022-01-17 NOTE — Progress Notes (Signed)
Patient ID: Alexis Holmes, female   DOB: 1951-05-21, 71 y.o.   MRN: [id number] [facility] Surgery Progress Note     Subjective: CC-  Feeling much better this morning. Abdominal pain continues to improve. Had some discomfort after eating mac and cheese yesterday, otherwise tolerated diet. No n/v. Continues to have some loose stool but starting to have some form. WBC down 12.5, VSS  Objective: Vital signs in last 24 hours: Temp:  [98.3 F (36.8 C)-99 F (37.2 C)] 98.6 F (37 C) (05/26 0518) Pulse Rate:  [73-82] 73 (05/26 0518) Resp:  [18] 18 (05/26 0518) BP: (119-121)/(60-65) 121/60 (05/26 0518) SpO2:  [94 %-97 %] 94 % (05/26 0518) Last BM Date : 01/16/22  Intake/Output from previous day: 05/25 0701 - 05/26 0700 In: 735 [P.O.:720] Out: 620 [Urine:600; Drains:20] Intake/Output this shift: Total I/O In: 3 [I.V.:3] Out: -   PE: Gen: alert, NAD Resp: rate and effort normal on room air GI: minimal RLQ TTP without rebound or guarding, mild distension, TG drain in place with scant thin bilious fluid, +BS  Lab Results:  Recent Labs    01/16/22 0541 01/17/22 0702  WBC 16.7* 12.5*  HGB 11.2* 10.4*  HCT 33.2* 31.8*  PLT 403* 429*   BMET Recent Labs    01/16/22 0541 01/17/22 0702  NA 139 140  K 3.9 3.6  CL 105 109  CO2 25 25  GLUCOSE 85 98  BUN 20 16  CREATININE 1.00 0.93  CALCIUM 8.3* 8.3*   PT/INR No results for input(s): LABPROT, INR in the last 72 hours. CMP     Component Value Date/Time   NA 140 01/17/2022 0702   K 3.6 01/17/2022 0702   CL 109 01/17/2022 0702   CO2 25 01/17/2022 0702   GLUCOSE 98 01/17/2022 0702   BUN 16 01/17/2022 0702   CREATININE 0.93 01/17/2022 0702   CALCIUM 8.3 (L) 01/17/2022 0702   PROT 7.5 01/09/2022 1035   ALBUMIN 4.1 01/09/2022 1035   AST 26 01/09/2022 1035   ALT 17 01/09/2022 1035   ALKPHOS 63 01/09/2022 1035   BILITOT 1.3 (H) 01/09/2022 1035   GFRNONAA >60 01/17/2022 0702   Lipase     Component Value Date/Time    LIPASE 37 01/09/2022 1035       Studies/Results: No results found.  Anti-infectives: Anti-infectives (From admission, onward)    Start     Dose/Rate Route Frequency Ordered Stop   01/15/22 1000  micafungin (MYCAMINE) 100 mg in sodium chloride 0.9 % 100 mL IVPB       Note to Pharmacy: Pharmacy may adjust dosing strength, schedule, rate of infusion, etc as needed to optimize therapy   100 mg 110 mL/hr over 1 Hours Intravenous Every 24 hours 01/15/22 0842 01/20/22 0959   01/12/22 1600  piperacillin-tazobactam (ZOSYN) IVPB 3.375 g        3.375 g 12.5 mL/hr over 240 Minutes Intravenous Every 8 hours 01/12/22 1313     01/09/22 2330  ceFEPIme (MAXIPIME) 2 g in sodium chloride 0.9 % 100 mL IVPB  Status:  Discontinued        2 g 200 mL/hr over 30 Minutes Intravenous Every 12 hours 01/09/22 1133 01/12/22 1246   01/09/22 2200  metroNIDAZOLE (FLAGYL) IVPB 500 mg  Status:  Discontinued        500 mg 100 mL/hr over 60 Minutes Intravenous Every 12 hours 01/09/22 1402 01/12/22 1246   01/09/22 1915  metroNIDAZOLE (FLAGYL) IVPB 500 mg  Status:  Discontinued  500 mg 100 mL/hr over 60 Minutes Intravenous Every 8 hours 01/09/22 1826 01/09/22 1828   01/09/22 1115  ceFEPIme (MAXIPIME) 2 g in sodium chloride 0.9 % 100 mL IVPB        2 g 200 mL/hr over 30 Minutes Intravenous  Once 01/09/22 1109 01/09/22 1156   01/09/22 1115  metroNIDAZOLE (FLAGYL) IVPB 500 mg        500 mg 100 mL/hr over 60 Minutes Intravenous  Once 01/09/22 1109 01/09/22 1224        Assessment/Plan Terminal ileitis, possible appendicitis vs crohn's disease (IBD)  - CT scan shows severely inflamed terminal ileum, the appendix is adjacent to the terminal ileum and is also severely inflamed, adjacent cecum and small bowel loops appear mildly inflamed; findings are favored to represent a nonspecific infectious or inflammatory terminal ileitis including Crohn's disease; no evidence of perforation or well-defined/drainable fluid  collection.  -s/p IR drain 5/22, culture STREP CONSTELLATUS, report pending.  - Continues to clinically improve. I think she can be discharged home with drain and oral antibiotics today. Will discuss follow up with IR for drain injection and repeat imaging, with GI for colonoscopy, and I will arrange follow up with surgeon. Plan to discharge home on augmentin and discuss with MD if antifungal is needed.   FEN - soft diet VTE - Lovenox ID - zosyn, micofungin   I reviewed hospitalist notes, last 24 h vitals and pain scores, last 48 h intake and output, last 24 h labs and trends (CBC, BMP)    LOS: 8 days    Wellington Hampshire, [facility] 01/17/2022, 9:32 AM Please see Amion for pager number during day hours 7:00am-4:30pm

## 2022-01-17 NOTE — Discharge Summary (Signed)
[facility]  Physician Discharge Summary   Patient ID: Alexis Holmes MRN: [id number] DOB/AGE: [birth year] 71 y.o.  Admit date: 01/09/2022 Discharge date: 01/17/2022    PCP: Holland Commons, FNP  DISCHARGE DIAGNOSES:  Principal Problem:   Appendicitis Active Problems:   Arthritis   Vitamin D deficiency   Sepsis due to undetermined organism ([place])   Ileitis, regional ([place])   History of colonic polyps   Nausea & vomiting   RECOMMENDATIONS FOR OUTPATIENT FOLLOW UP: General surgery to arrange outpatient follow-up General surgery also arranging outpatient evaluation by gastroenterology for colonoscopy Interventional radiology to follow-up in their drain clinic   Home Health: None Equipment/Devices: None  CODE STATUS: Full code  DISCHARGE CONDITION: fair  Diet recommendation: Regular as tolerated  INITIAL HISTORY: Alexis Holmes is an 71 y.o. female past medical history of hyperlipidemia and previous nephrolithiasis comes in complaining of abdominal pain that started on Tuesday located in her lower quadrant it has progressively gotten worse to the point where she cannot move.  White blood cell count was 20,000 CT scan showed appendix severely inflamed with the cecum and small bowel.   Consultations: General surgery Interventional radiology  Procedures: CT-guided drain placement   HOSPITAL COURSE:   Ruptured appendix versus terminal ileitis with intra-abdominal abscess/sepsis present on admission  General surgery was consulted.  CT scan showed multiple abscesses.  Interventional radiology was consulted and the patient underwent CT-guided drain placement.   Cultures from fluid showed ABUNDANT STREPTOCOCCUS CONSTELLATUS ABUNDANT BACTEROIDES THETAIOTAOMICRON, BETA LACTAMASE POSITIVE.  Sensitivities reviewed.  Patient was transition from Zosyn to Augmentin.  Patient was also started on micafungin by general surgery which need not be continued at discharge per  general surgery.  Patient's WBC has improved.  She feels much better.  Cleared by general surgery for discharge.   Hypokalemia This was repleted   Patient is feeling better.  Cleared by general surgery for discharge.  Okay for discharge today.   PERTINENT LABS:  The results of significant diagnostics from this hospitalization (including imaging, microbiology, ancillary and laboratory) are listed below for reference.    Microbiology: Recent Results (from the past 240 hour(s))  Urine Culture     Status: None   Collection Time: 01/09/22 11:09 AM   Specimen: Urine, Clean Catch  Result Value Ref Range Status   Specimen Description   Final    URINE, CLEAN CATCH Performed at [facility], [address] [place], [place] [address]    Special Requests   Final    NONE Performed at [facility], [address] [place], [place] [address]    Culture   Final    NO GROWTH Performed at [facility], [place] [address], [place],  [address]    Report Status 01/10/2022 FINAL  Final  Blood culture (routine x 2)     Status: Abnormal   Collection Time: 01/09/22 11:09 AM   Specimen: Left Antecubital; Blood  Result Value Ref Range Status   Specimen Description   Final    LEFT ANTECUBITAL Performed at [facility], [address] [place], [place] [address]    Special Requests   Final    BOTTLES DRAWN AEROBIC AND ANAEROBIC Blood Culture adequate volume Performed at [facility], [address] [place], [place] [address]    Culture  Setup Time   Final    GRAM NEGATIVE RODS ANAEROBIC BOTTLE ONLY CRITICAL RESULT CALLED TO, READ BACK BY  AND VERIFIED WITH: PHARMD ELLEN JACKSON 01/12/22'@2'$ :36 BY TW    Culture (A)  Final    BACTEROIDES THETAIOTAOMICRON BETA LACTAMASE POSITIVE Performed at [facility], [place] [address] [place], [place] [address]    Report Status 01/14/2022 FINAL  Final  Blood culture (routine x 2)     Status: None    Collection Time: 01/09/22 11:09 AM   Specimen: Right Antecubital; Blood  Result Value Ref Range Status   Specimen Description   Final    RIGHT ANTECUBITAL Performed at [facility], [address] [place], [place] [address]    Special Requests   Final    BOTTLES DRAWN AEROBIC AND ANAEROBIC Blood Culture adequate volume Performed at [facility], [address] [place], [place] [address]    Culture   Final    NO GROWTH 5 DAYS Performed at [facility], [place] [address], [place], [place] [address]    Report Status 01/14/2022 FINAL  Final  Blood Culture ID Panel (Reflexed)     Status: None   Collection Time: 01/09/22 11:09 AM  Result Value Ref Range Status   Enterococcus faecalis NOT DETECTED NOT DETECTED Final   Enterococcus Faecium NOT DETECTED NOT DETECTED Final   Listeria monocytogenes NOT DETECTED NOT DETECTED Final   Staphylococcus species NOT DETECTED NOT DETECTED Final   Staphylococcus aureus (BCID) NOT DETECTED NOT DETECTED Final   Staphylococcus epidermidis NOT DETECTED NOT DETECTED Final   Staphylococcus lugdunensis NOT DETECTED NOT DETECTED Final   Streptococcus species NOT DETECTED NOT DETECTED Final   Streptococcus agalactiae NOT DETECTED NOT DETECTED Final   Streptococcus pneumoniae NOT DETECTED NOT DETECTED Final   Streptococcus pyogenes NOT DETECTED NOT DETECTED Final   A.calcoaceticus-baumannii NOT DETECTED NOT DETECTED Final   Bacteroides fragilis NOT DETECTED NOT DETECTED Final   Enterobacterales NOT DETECTED NOT DETECTED Final   Enterobacter cloacae complex NOT DETECTED NOT DETECTED Final   Escherichia coli NOT DETECTED NOT DETECTED Final   Klebsiella aerogenes NOT DETECTED NOT DETECTED Final   Klebsiella oxytoca NOT DETECTED NOT DETECTED Final   Klebsiella pneumoniae NOT DETECTED NOT DETECTED Final   Proteus species NOT DETECTED NOT DETECTED Final   Salmonella species NOT DETECTED NOT DETECTED Final   Serratia marcescens NOT  DETECTED NOT DETECTED Final   Haemophilus influenzae NOT DETECTED NOT DETECTED Final   Neisseria meningitidis NOT DETECTED NOT DETECTED Final   Pseudomonas aeruginosa NOT DETECTED NOT DETECTED Final   Stenotrophomonas maltophilia NOT DETECTED NOT DETECTED Final   Candida albicans NOT DETECTED NOT DETECTED Final   Candida auris NOT DETECTED NOT DETECTED Final   Candida glabrata NOT DETECTED NOT DETECTED Final   Candida krusei NOT DETECTED NOT DETECTED Final   Candida parapsilosis NOT DETECTED NOT DETECTED Final   Candida tropicalis NOT DETECTED NOT DETECTED Final   Cryptococcus neoformans/gattii NOT DETECTED NOT DETECTED Final    Comment: Performed at [facility], [place] [address], [place], [place] [address]  MRSA Next Gen by PCR, Nasal     Status: None   Collection Time: 01/10/22  5:43 AM   Specimen: Nasal Mucosa; Nasal Swab  Result Value Ref Range Status   MRSA by PCR Next Gen NOT DETECTED NOT DETECTED Final    Comment: (NOTE) The GeneXpert MRSA Assay (FDA approved for NASAL specimens only), is one component of a comprehensive MRSA colonization surveillance program. It is not intended to diagnose MRSA infection nor to guide or monitor treatment for  MRSA infections. Test performance is not FDA approved in patients less than 14 years old. Performed at [facility], [place] [address] [place], [place] [address]   C Difficile Quick Screen (NO PCR Reflex)     Status: None   Collection Time: 01/12/22  8:37 AM   Specimen: STOOL  Result Value Ref Range Status   C Diff antigen NEGATIVE NEGATIVE Final   C Diff toxin NEGATIVE NEGATIVE Final   C Diff interpretation No C. difficile detected.  Final    Comment: Performed at [facility], [place] [address] [place], [place] [address]  Aerobic/Anaerobic Culture w Gram Stain (surgical/deep wound)     Status: None   Collection Time: 01/13/22  3:34 PM   Specimen: Abscess  Result Value Ref Range Status    Specimen Description   Final    ABSCESS Performed at [place] [address], [place], [place] [address]    Special Requests   Final    NONE Performed at [facility], [place] [address], [place], [place] [address]    Gram Stain   Final    MODERATE WBC PRESENT, PREDOMINANTLY PMN ABUNDANT GRAM POSITIVE COCCI FEW GRAM NEGATIVE RODS    Culture   Final    ABUNDANT STREPTOCOCCUS CONSTELLATUS ABUNDANT BACTEROIDES THETAIOTAOMICRON BETA LACTAMASE POSITIVE Performed at [facility], [place] [address], [place],  [address]    Report Status 01/17/2022 FINAL  Final   Organism ID, Bacteria STREPTOCOCCUS CONSTELLATUS  Final      Susceptibility   Streptococcus constellatus - MIC*    PENICILLIN <=0.06 SENSITIVE Sensitive     CEFTRIAXONE 0.25 SENSITIVE Sensitive     ERYTHROMYCIN <=0.12 SENSITIVE Sensitive     LEVOFLOXACIN <=0.25 SENSITIVE Sensitive     VANCOMYCIN 0.5 SENSITIVE Sensitive     * ABUNDANT STREPTOCOCCUS CONSTELLATUS     Labs:   Basic Metabolic Panel: Recent Labs  Lab 01/12/22 0529 01/14/22 0532 01/15/22 0524 01/16/22 0541 01/17/22 0702  NA 140 139 138 139 140  K 3.3* 3.5 2.8* 3.9 3.6  CL 113* 108 99 105 109  CO2 18* '22 29 25 25  '$ GLUCOSE 87 97 98 85 98  BUN '19 18 17 20 16  '$ CREATININE 1.08* 1.08* 1.10* 1.00 0.93  CALCIUM 7.7* 7.5* 8.1* 8.3* 8.3*  MG  --   --  1.8 2.4  --     CBC: Recent Labs  Lab 01/13/22 0442 01/14/22 0532 01/15/22 0524 01/16/22 0541 01/17/22 0702  WBC 15.0* 17.5* 17.9* 16.7* 12.5*  HGB 10.1* 9.9* 10.6* 11.2* 10.4*  HCT 30.0* 29.3* 31.0* 33.2* 31.8*  MCV 93.5 94.2 92.8 93.3 93.5  PLT 327 281 362 403* 429*    BNP: BNP (last 3 results) Recent Labs    01/11/22 0245  BNP 263.2*      IMAGING STUDIES DG Abdomen 1 View  Result Date: 01/09/2022 CLINICAL DATA:  Lower abdominal pain, possible bowel impaction EXAM: ABDOMEN - 1 VIEW COMPARISON:  Prior abdominal radiograph 03/12/2020  FINDINGS: No large free air. No focal bowel dilation to suggest obstruction. The colonic stool burden is low. No evidence of fecal impaction. Gas noted within the sigmoid colon. Lung bases are clear. No abnormal calcifications. IMPRESSION: 1. No evidence of obstruction. 2. Low/normal colonic stool burden.  No evidence of fecal impaction. Electronically Signed   By: Jacqulynn Cadet M.D.   On: 01/09/2022 08:52   CT ABDOMEN PELVIS W CONTRAST  Result Date: 01/12/2022 CLINICAL DATA:  Follow-up terminal ileitis. Left lower quadrant pain. EXAM: CT ABDOMEN AND PELVIS WITH CONTRAST TECHNIQUE: Multidetector CT imaging of the abdomen and pelvis was performed using the standard protocol following bolus administration of intravenous contrast. RADIATION DOSE REDUCTION: This exam was performed according to the departmental dose-optimization program which includes automated exposure control, adjustment of the mA and/or kV according to patient size and/or use of iterative reconstruction technique. CONTRAST:  124m OMNIPAQUE IOHEXOL 300 MG/ML  SOLN COMPARISON:  01/09/2022 FINDINGS: Lower Chest: New small to moderate bilateral pleural effusions and bilateral lower lobe atelectasis. Hepatobiliary: No hepatic masses identified. Contrast noted in the gallbladder, likely from recent CT, however there is no evidence of cholecystitis or biliary ductal dilatation. Pancreas:  No mass or inflammatory changes. Spleen: Within normal limits in size and appearance. Adrenals/Urinary Tract: No masses identified. No evidence of ureteral calculi or hydronephrosis. Foley catheter seen within the bladder. Stomach/Bowel: Moderate dilatation of small bowel loops is now seen with transition point in the right lower quadrant, consistent with small-bowel obstruction. Near the transition point in the right lower quadrant there is a complex multilocular fluid collection with peripheral rim enhancement measuring 6.3 x 2.2 cm on image 78/2. This is highly  suspicious for ruptured appendicitis with abscess. Moderate wall thickening of the adjacent terminal ileum is likely reactive in etiology. There is an additional smaller rim enhancing fluid collection seen in the right pelvic cul-de-sac area which measures 3.5 x 2.1 cm on image 80/2, also suspicious for abscess. Small amount of free fluid noted in the perihepatic space in more since pouch. Mild diffuse mesenteric and body wall edema also seen. Vascular/Lymphatic: No pathologically enlarged lymph nodes. No acute vascular findings. Aortic atherosclerotic calcification incidentally noted. Reproductive:  No mass or other significant abnormality. Other:  None. Musculoskeletal:  No suspicious bone lesions identified. IMPRESSION: Development of distal small bowel obstruction, with transition point in right lower quadrant. 6.3 cm multilocular fluid collection with rim enhancement in right lower quadrant, highly suspicious for ruptured appendicitis with abscess. Smaller 3.5 cm rim enhancing fluid collection in right pelvic cul-de-sac, also suspicious for abscess. Moderate inflammation of terminal ileum, likely reactive in etiology. New small to moderate bilateral pleural effusions, and bilateral lower lobe atelectasis. Aortic Atherosclerosis (ICD10-I70.0). These results will be called to the ordering clinician or representative by the Radiologist Assistant, and communication documented in the PACS or [employer] Electronically Signed   By: JMarlaine HindM.D.   On: 01/12/2022 10:13   CT ABDOMEN PELVIS W CONTRAST  Result Date: 01/09/2022 CLINICAL DATA:  Abdominal pain, acute, nonlocalized EXAM: CT ABDOMEN AND PELVIS WITH CONTRAST TECHNIQUE: Multidetector CT imaging of the abdomen and pelvis was performed using the standard protocol following bolus administration of intravenous contrast. RADIATION DOSE REDUCTION: This exam was performed according to the departmental dose-optimization program which includes automated  exposure control, adjustment of the mA and/or kV according to patient size and/or use of iterative reconstruction technique. CONTRAST:  103mOMNIPAQUE IOHEXOL 300 MG/ML  SOLN COMPARISON:  CT 02/13/2020, 11/27/2016 FINDINGS: Lower chest: Included lung bases are clear.  Heart size is normal. Hepatobiliary: No focal liver abnormality is seen. No gallstones, gallbladder wall thickening, or biliary dilatation. Pancreas: Unremarkable. No pancreatic ductal dilatation or surrounding inflammatory changes. Spleen: Normal in size without focal abnormality. Adrenals/Urinary Tract: Unremarkable adrenal glands. Small right renal cysts, stable from 2018 and benign. Kidneys enhance symmetrically. No renal stone or hydronephrosis. Urinary bladder is incompletely distended. Stomach/Bowel: Markedly abnormal appearance of the terminal ileum with severe wall  thickening, mucosal hyperenhancement, and submucosal edema (series 5, images 48-51). The appendix is seen adjacent to the terminal ileum and also appears inflamed (series 5, images 34-46). Adjacent cecum and small bowel loops also appear mildly inflamed. No dilated loops of bowel to suggest obstruction. Stomach within normal limits. Vascular/Lymphatic: Scattered aortoiliac atherosclerotic calcifications without aneurysm. No abdominopelvic lymphadenopathy. Reproductive: Inflammatory changes within the right adnexal region. Right ovary is difficult to discern. Uterus and left adnexa are unremarkable. Other: No pneumoperitoneum.  No abdominal wall hernia. Musculoskeletal: No acute or significant osseous findings. IMPRESSION: 1. Severely inflamed terminal ileum. The appendix is seen adjacent to the terminal ileum and is also severely inflamed. Adjacent cecum and small bowel loops also appear mildly inflamed. Findings are favored to represent a nonspecific infectious or inflammatory terminal ileitis including Crohn's disease. A reactive terminal ileitis secondary to adjacent acute  appendicitis is also a consideration. No evidence of perforation or well-defined/drainable fluid collection. Surgical and/or GI consultation is recommended. 2. Inflammatory changes within the right adnexal region. Right ovary is difficult to discern. The possibility of an entero-ovarian fistulized not excluded. Aortic Atherosclerosis (ICD10-I70.0). Electronically Signed   By: Davina Poke D.O.   On: 01/09/2022 11:40   DG CHEST PORT 1 VIEW  Result Date: 01/13/2022 CLINICAL DATA:  Dyspnea. Drainage of pelvic fluid collection this afternoon with subsequent episode of bacteremia. Rigors, tachycardia, tachypnea. EXAM: PORTABLE CHEST 1 VIEW COMPARISON:  AP chest 01/11/2022, 01/09/2022 FINDINGS: Cardiac silhouette and mediastinal contours are within normal limits. There are again low lung volumes. Elevation of the right hemidiaphragm is similar to prior. Small bilateral pleural effusions. No pneumothorax. No acute skeletal abnormality. IMPRESSION: Decreased lung volumes and small bilateral pleural effusions with bibasilar atelectasis, similar to prior. Electronically Signed   By: Yvonne Kendall M.D.   On: 01/13/2022 17:45   DG CHEST PORT 1 VIEW  Result Date: 01/11/2022 CLINICAL DATA:  Hypoxia EXAM: PORTABLE CHEST 1 VIEW COMPARISON:  01/09/2022 FINDINGS: The lung volumes are small, but are stable since prior examination. Bibasilar opacification likely relates to the presence of small bilateral pleural effusions, new since prior examination. Associated bibasilar atelectasis. No pneumothorax. Cardiac size within normal limits. Pulmonary vascularity is normal. No acute bone abnormality. IMPRESSION: Stable pulmonary hypoinflation. Interval development of probable small bilateral pleural effusions. Electronically Signed   By: Fidela Salisbury M.D.   On: 01/11/2022 03:14   DG CHEST PORT 1 VIEW  Result Date: 01/09/2022 CLINICAL DATA:  Hypoxia EXAM: PORTABLE CHEST 1 VIEW COMPARISON:  CT from 02/01/2018 FINDINGS:  Cardiac shadow is within normal limits. The lungs are well aerated bilaterally. Patchy atelectatic changes are noted bilaterally. No sizable effusion is noted. No bony abnormality is seen. IMPRESSION: Bibasilar atelectasis. Electronically Signed   By: Inez Catalina M.D.   On: 01/09/2022 22:43   CT IMAGE GUIDED DRAINAGE BY PERCUTANEOUS CATHETER  Result Date: 01/15/2022 INDICATION: Patient has a small 4 x 2.3 cm loculated abscess at the right lower pelvis requiring drain insertion EXAM: CT-guided 10 French drainage catheter insertion into the right lower pelvic absence. TECHNIQUE: Multidetector CT imaging of the was performed following the standard protocol with/without IV contrast. RADIATION DOSE REDUCTION: This exam was performed according to the departmental dose-optimization program which includes automated exposure control, adjustment of the mA and/or kV according to patient size and/or use of iterative reconstruction technique. MEDICATIONS: The patient is currently admitted to the hospital and receiving intravenous antibiotics. The antibiotics were administered within an appropriate time frame prior to the initiation of the  procedure. ANESTHESIA/SEDATION: Moderate (conscious) sedation was employed during this procedure. A total of Versed 2.0 mg and Fentanyl 100 mcg was administered intravenously by the radiology nurse. Total intra-service moderate Sedation Time: 30 minutes. The patient's level of consciousness and vital signs were monitored continuously by radiology nursing throughout the procedure under my direct supervision. COMPLICATIONS: None immediate. PROCEDURE: Informed written consent was obtained from the patient after a thorough discussion of the procedural risks, benefits and alternatives. All questions were addressed. Maximal Sterile Barrier Technique was utilized including caps, mask, sterile gowns, sterile gloves, sterile drape, hand hygiene and skin antiseptic. A timeout was performed prior to  the initiation of the procedure. Patient was placed prone on the CT table and selected CT imaging of the abscess area was performed. Skin corresponding to the abscess pocket was marked, prepped, draped and anesthetized with 1% xylocaine with the needle to be inserted by the right posterolateral transgluteal approach. Under CT guidance, 18 gauge trocar needle was inserted into the abscess pocket. The inner stylet was removed, 035 inch Amplatz wire was introduced, access site was dilated and a 10 French drainage catheter was inserted to form the Cope loop within the abscess pocket. Approximately 10 mL of thin foul-smelling pus was drained. Catheter was sutured to the skin using 2-0 silk and was connected to a JP bulb. Sterile dressing was placed. The specimen was sent to the lab for culture and sensitivity. Patient tolerated the procedure well. IMPRESSION: CT-guided drain insertion into the right lower pelvic abscess. Electronically Signed   By: Frazier Richards M.D.   On: 01/15/2022 11:27    DISCHARGE EXAMINATION: Vitals:   01/16/22 0526 01/16/22 1408 01/16/22 2054 01/17/22 0518  BP: 122/61 119/65 120/60 121/60  Pulse: 76 80 82 73  Resp: '18  18 18  '$ Temp: 98.2 F (36.8 C) 98.3 F (36.8 C) 99 F (37.2 C) 98.6 F (37 C)  TempSrc: Oral Oral Oral Oral  SpO2: 92% 97% 94% 94%  Weight:      Height:       Please see progress note from earlier today   DISPOSITION: Home  Discharge Instructions     Ambulatory referral to Gastroenterology   Complete by: As directed    Admit with ileitis vs appendicitis. Needs colonoscopy in 6 weeks   What is the reason for referral?: Colonoscopy   Call MD for:  difficulty breathing, headache or visual disturbances   Complete by: As directed    Call MD for:  extreme fatigue   Complete by: As directed    Call MD for:  persistant dizziness or light-headedness   Complete by: As directed    Call MD for:  persistant nausea and vomiting   Complete by: As directed     Call MD for:  severe uncontrolled pain   Complete by: As directed    Call MD for:  temperature >100.4   Complete by: As directed    Discharge instructions   Complete by: As directed    Please take your medications as prescribed.  Follow-up with surgeon as instructed.  Seek attention if your symptoms worsen.  You were cared for by a hospitalist during your hospital stay. If you have any questions about your discharge medications or the care you received while you were in the hospital after you are discharged, you can call the unit and asked to speak with the hospitalist on call if the hospitalist that took care of you is not available. Once you are discharged, your primary  care physician will handle any further medical issues. Please note that NO REFILLS for any discharge medications will be authorized once you are discharged, as it is imperative that you return to your primary care physician (or establish a relationship with a primary care physician if you do not have one) for your aftercare needs so that they can reassess your need for medications and monitor your lab values. If you do not have a primary care physician, you can call [phone] for a physician referral.   Discharge wound care:   Complete by: As directed    Drain care as instructed by surgery/IR.   Increase activity slowly   Complete by: As directed          Allergies as of 01/17/2022   No Known Allergies      Medication List     STOP taking these medications    cyclobenzaprine 5 MG tablet Commonly known as: FLEXERIL   naproxen sodium 550 MG tablet Commonly known as: Anaprox DS       TAKE these medications    alendronate 70 MG tablet Commonly known as: FOSAMAX Take 70 mg by mouth once a week.   amoxicillin-clavulanate 875-125 MG tablet Commonly known as: AUGMENTIN Take 1 tablet by mouth 2 (two) times daily for 10 days.   aspirin 81 MG chewable tablet Chew 81 mg by mouth daily.   B COMPLEX 100 PO Take  1 tablet by mouth daily.   Biotin 1 MG Caps Take 1 mg by mouth daily.   CITRACAL +D3 PO Take 1 tablet by mouth daily.   DULoxetine 20 MG capsule Commonly known as: CYMBALTA Take 20 mg by mouth daily.   Fish Oil 1000 MG Caps Take 1,000 mg by mouth daily.   ibuprofen 200 MG tablet Commonly known as: ADVIL Take 200-400 mg by mouth every 6 (six) hours as needed for moderate pain.   Lecithin 1200 MG Caps Take 1,200 mg by mouth daily.   Lutein 40 MG Caps Take 40 mg by mouth daily.   magnesium 30 MG tablet Take 30 mg by mouth daily.   magnesium hydroxide 400 MG/5ML suspension Commonly known as: MILK OF MAGNESIA Take 30 mLs by mouth daily as needed for mild constipation.   oxyCODONE 5 MG immediate release tablet Commonly known as: Oxy IR/ROXICODONE Take 1 tablet (5 mg total) by mouth every 6 (six) hours as needed for severe pain.   psyllium 58.6 % powder Commonly known as: METAMUCIL Take 1 packet by mouth daily as needed (constipation).   saccharomyces boulardii 250 MG capsule Commonly known as: FLORASTOR Take 1 capsule (250 mg total) by mouth 2 (two) times daily for 14 days.   Turmeric 500 MG Caps Take 500 mg by mouth daily.   vitamin B-12 1000 MCG tablet Commonly known as: CYANOCOBALAMIN Take 1,000 mcg by mouth daily.   vitamin C 500 MG tablet Commonly known as: ASCORBIC ACID Take 500 mg by mouth daily.   Vitamin D 125 MCG (5000 UT) Caps Take 5,000 Units by mouth daily.               Discharge Care Instructions  (From admission, onward)           Start     Ordered   01/17/22 0000  Discharge wound care:       Comments: Drain care as instructed by surgery/IR.   01/17/22 1054              Follow-up Information  [facility]. Call.   Specialty: Gastroenterology Why: Their office should contact you to arrange follow up for a colonoscopy Contact information: [place]  [phone]        Michael Boston, MD. Go on 02/10/2022.   Specialties: General Surgery, Colon and Rectal Surgery Why: Your appointment is 6/19 at 8:45am Please arrive 30 minutes prior to your appointment to check in and fill out paperwork. Bring photo ID and insurance information. Contact information: [address] [place] [address] [phone]         Glori Bickers, MD Follow up.   Specialty: Interventional Radiology Contact information: [address] [place] [address] [phone]                 TOTAL DISCHARGE TIME: 35-minutes  Bonnielee Haff  [facility] Pager on [email or web address]  01/18/2022, 10:28 AM

## 2022-01-21 ENCOUNTER — Other Ambulatory Visit: Payer: Self-pay | Admitting: Surgery

## 2022-01-21 DIAGNOSIS — R188 Other ascites: Secondary | ICD-10-CM

## 2022-01-21 NOTE — Progress Notes (Signed)
Received message from CT that Ms. Alexis Holmes called regarding loose suture around intraabdominal abscess drain catheter.  In speaking with Ms. Alexis Holmes, she reports one of three sutures has become loose.  She endorses continued OP into bulb, no leaking around site, and easy daily flushing.  The site is neither red nor irritated. She feels well overall.  The catheter does not appear to have withdrawn from level of skin.    Given the tube is still functioning and seems in place, encouraged being conscious of catheter getting caught or pulled on and suggested dressing the site so the catheter sits against the skin.  She voiced understanding.  All questions were answered to satisfaction.  Will plan to see her at scheduled clinic follow up or sooner, if needed.   Electronically Signed: Pasty Spillers, PA-C 01/21/2022, 3:41 PM

## 2022-01-22 ENCOUNTER — Other Ambulatory Visit (HOSPITAL_COMMUNITY): Payer: Self-pay

## 2022-01-22 MED ORDER — NORMAL SALINE FLUSH 0.9 % IV SOLN
INTRAVENOUS | 3 refills | Status: DC
  Filled 2022-01-22: qty 300, 30d supply, fill #0

## 2022-01-30 ENCOUNTER — Encounter: Payer: Self-pay | Admitting: *Deleted

## 2022-01-30 ENCOUNTER — Ambulatory Visit
Admission: RE | Admit: 2022-01-30 | Discharge: 2022-01-30 | Disposition: A | Discharge status: Home / Self Care | Payer: Medicare Other | Source: Ambulatory Visit | Attending: Radiology | Admitting: Radiology

## 2022-01-30 ENCOUNTER — Ambulatory Visit
Admission: RE | Admit: 2022-01-30 | Discharge: 2022-01-30 | Disposition: A | Discharge status: Home / Self Care | Payer: Medicare Other | Source: Ambulatory Visit | Attending: Surgery | Admitting: Surgery

## 2022-01-30 DIAGNOSIS — R188 Other ascites: Secondary | ICD-10-CM

## 2022-01-30 HISTORY — PX: IR RADIOLOGIST EVAL & MGMT: IMG5224

## 2022-01-30 MED ORDER — IOPAMIDOL (ISOVUE-300) INJECTION 61%
100.0000 mL | Freq: Once | INTRAVENOUS | Status: AC | PRN
Start: 2022-01-30 — End: 2022-01-30
  Administered 2022-01-30: 100 mL via INTRAVENOUS

## 2022-01-30 NOTE — Progress Notes (Addendum)
Referring Physician(s): Dr Clyda Greener  Chief Complaint: The patient is seen in follow up today s/p 5/22 Rt Transgluteal pelvic abscess drain placement in IR  History of present illness:  Admitted to hospital with abd pain for few days Leukocytosis and lactic acid of 2.8 CT scan of the abdomen pelvis showed appendix is severely inflamed along with the cecum and small bowel likely secondary to adjacent acute appendicitis. Was evaluated with [facility] Request made for abscess drain Drain placed in IR 01/13/22  Here today for re evaluation and CT  OP is minimal to none for few days Flushes easily- 5-10 cc daily Afeb Denies N/V/D Denies pain; denies chills  Completed antibiotics 2 days ago  To see Dr Johney Maine June 19  Past Medical History:  Diagnosis Date   Arthritis    Colon polyps    Hypercholesterolemia    Intention tremor    Right   Menopause    Osteopenia    Vitamin D deficiency     Past Surgical History:  Procedure Laterality Date   EXTRACORPOREAL SHOCK WAVE LITHOTRIPSY Left 02/20/2020   Procedure: EXTRACORPOREAL SHOCK WAVE LITHOTRIPSY (ESWL);  Surgeon: Raynelle Bring, MD;  Location: [facility];  Service: Urology;  Laterality: Left;   LEG SURGERY  1988-1990   s/p fracture    Allergies: Patient has no known allergies.  Medications: Prior to Admission medications   Medication Sig Start Date End Date Taking? Authorizing Provider  alendronate (FOSAMAX) 70 MG tablet Take 70 mg by mouth once a week. 02/26/21   [provider]  aspirin 81 MG chewable tablet Chew 81 mg by mouth daily.    [provider]  B Complex Vitamins (B COMPLEX 100 PO) Take 1 tablet by mouth daily.    [provider]  Biotin 1 MG CAPS Take 1 mg by mouth daily.    [provider]  Calcium-Phosphorus-Vitamin D (CITRACAL +D3 PO) Take 1 tablet by mouth daily.    [provider]  Cholecalciferol (VITAMIN D) 125 MCG (5000 UT) CAPS Take 5,000 Units by  mouth daily.    [provider]  DULoxetine (CYMBALTA) 20 MG capsule Take 20 mg by mouth daily.    [provider]  ibuprofen (ADVIL) 200 MG tablet Take 200-400 mg by mouth every 6 (six) hours as needed for moderate pain.    [provider]  Lecithin 1200 MG CAPS Take 1,200 mg by mouth daily.    [provider]  Lutein 40 MG CAPS Take 40 mg by mouth daily.    [provider]  magnesium 30 MG tablet Take 30 mg by mouth daily.    [provider]  magnesium hydroxide (MILK OF MAGNESIA) 400 MG/5ML suspension Take 30 mLs by mouth daily as needed for mild constipation.    [provider]  Omega-3 Fatty Acids (FISH OIL) 1000 MG CAPS Take 1,000 mg by mouth daily.    [provider]  oxyCODONE (OXY IR/ROXICODONE) 5 MG immediate release tablet Take 1 tablet (5 mg total) by mouth every 6 (six) hours as needed for severe pain. 01/17/22   Meuth, Brooke A, PA-C  psyllium (METAMUCIL) 58.6 % powder Take 1 packet by mouth daily as needed (constipation).    [provider]  saccharomyces boulardii (FLORASTOR) 250 MG capsule Take 1 capsule (250 mg total) by mouth 2 (two) times daily for 14 days. 01/17/22 01/31/22  Bonnielee Haff, MD  Sodium Chloride Flush (NORMAL SALINE FLUSH) 0.9 % SOLN Flush pelvic  drain with 5 mls sterile saline once daily as directed 01/17/22   Allred, Darrell K, PA-C  Turmeric 500 MG CAPS Take 500 mg by mouth daily.    [provider]  vitamin B-12 (CYANOCOBALAMIN) 1000 MCG tablet Take 1,000 mcg by mouth daily.    [provider]  vitamin C (ASCORBIC ACID) 500 MG tablet Take 500 mg by mouth daily.    [provider]     Family History  Problem Relation Age of Onset   Fibromyalgia Brother    Diabetes Maternal Grandfather    Breast cancer Maternal Aunt     Social History   Socioeconomic History   Marital status: Divorced    Spouse name: Not on file   Number of children: 0   Years of  education: Assoc deg   Highest education level: Not on file  Occupational History   Occupation: [employer]    Employer: [employer]  Tobacco Use   Smoking status: Never   Smokeless tobacco: Never  Substance and Sexual Activity   Alcohol use: No   Drug use: No   Sexual activity: Yes    Partners: Female  Other Topics Concern   Not on file  Social History Narrative   Not on file   Social Determinants of Health   Financial Resource Strain: Not on file  Food Insecurity: Not on file  Transportation Needs: Not on file  Physical Activity: Not on file  Stress: Not on file  Social Connections: Not on file     Vital Signs: There were no vitals taken for this visit.  Physical Exam Vitals reviewed.  Skin:    General: Skin is warm.     Comments: Site is clean an dry NT no bleeding OP dark brown in JP OP  is less that 1 cc in JP  CT reviewed with Dr Osborne Casco resolution of abscess  Rt TG drain removed without complication per order Dressing placed       Imaging: No results found.  Labs:  CBC: Recent Labs    01/14/22 0532 01/15/22 0524 01/16/22 0541 01/17/22 0702  WBC 17.5* 17.9* 16.7* 12.5*  HGB 9.9* 10.6* 11.2* 10.4*  HCT 29.3* 31.0* 33.2* 31.8*  PLT 281 362 403* 429*    COAGS: Recent Labs    01/13/22 1111  INR 1.1    BMP: Recent Labs    01/14/22 0532 01/15/22 0524 01/16/22 0541 01/17/22 0702  NA 139 138 139 140  K 3.5 2.8* 3.9 3.6  CL 108 99 105 109  CO2 '22 29 25 25  '$ GLUCOSE 97 98 85 98  BUN '18 17 20 16  '$ CALCIUM 7.5* 8.1* 8.3* 8.3*  CREATININE 1.08* 1.10* 1.00 0.93  GFRNONAA 55* 53* 60* >60    LIVER FUNCTION TESTS: Recent Labs    01/09/22 1035  BILITOT 1.3*  AST 26  ALT 17  ALKPHOS 63  PROT 7.5  ALBUMIN 4.1    Assessment:  Rt pelvic abscess drain placed in IR 01/13/22 CT today revealing resolution of abscess Pt is afeb No chills; No pain To see Dr Johney Maine June 19 Removal of drain per Dr Earleen Newport order She has  good understanding of plan   Signed: Lavonia Drafts, PA-C 01/30/2022, 1:04 PM   Please refer to Dr. Earleen Newport attestation of this note for management and plan.

## 2022-01-31 ENCOUNTER — Encounter: Payer: Self-pay | Admitting: Gastroenterology

## 2022-02-10 ENCOUNTER — Ambulatory Visit: Payer: Self-pay | Admitting: Surgery

## 2022-02-10 DIAGNOSIS — R739 Hyperglycemia, unspecified: Secondary | ICD-10-CM

## 2022-02-13 ENCOUNTER — Telehealth: Payer: Self-pay | Admitting: Gastroenterology

## 2022-02-13 NOTE — Telephone Encounter (Signed)
Spoke with Caryl Pina at [facility] and pt is scheduled to have surgery with Dr. Johney Maine on 8/18. Per Caryl Pina pt has to have colonoscopy on 8/16. Caryl Pina states she has to have a return phone call when pt is scheduled for colonoscopy so they can confirm with Dr. Johney Maine. Please call her at [phone].

## 2022-02-13 NOTE — Telephone Encounter (Signed)
Caryl Pina from [facility] calling in regards to patient apt. Please give her a call back to advise.

## 2022-02-26 ENCOUNTER — Encounter: Payer: Self-pay | Admitting: Gastroenterology

## 2022-02-26 ENCOUNTER — Ambulatory Visit (INDEPENDENT_AMBULATORY_CARE_PROVIDER_SITE_OTHER): Payer: Medicare Other | Admitting: Gastroenterology

## 2022-02-26 VITALS — BP 130/60 | HR 75 | Ht 61.0 in | Wt 128.0 lb

## 2022-02-26 DIAGNOSIS — R935 Abnormal findings on diagnostic imaging of other abdominal regions, including retroperitoneum: Secondary | ICD-10-CM | POA: Diagnosis not present

## 2022-02-26 DIAGNOSIS — K529 Noninfective gastroenteritis and colitis, unspecified: Secondary | ICD-10-CM | POA: Diagnosis not present

## 2022-02-26 MED ORDER — NA SULFATE-K SULFATE-MG SULF 17.5-3.13-1.6 GM/177ML PO SOLN: 1.0000 | Freq: Once | ORAL | 0 refills | Status: AC

## 2022-02-26 NOTE — Progress Notes (Addendum)
02/26/2022 Alexis Holmes [id number] [birth year]   HISTORY OF PRESENT ILLNESS: This is a 71 year old female who is here to discuss colonoscopy at the request of Dr. Johney Maine.  She is new to our practice, had previous colonoscopy by Dr. Earlean Shawl.  We do not have those records, but we will try to request them.  She believes she had her last colonoscopy in 2019 or 2020.  Anyway, she is here today for follow-up after a recent hospitalization.  She says that on May 17 she had sudden onset of severe lower abdominal pain.  She says that leading up to that she felt fine, no abdominal pain and no issues with her bowel habits.  The following morning on May 18 when she woke up the pain was still present and more severe so she went to the emergency department.  CT scan of the abdomen and pelvis with contrast showed severely inflamed terminal ileum and severely inflamed appendix with adjacent cecum and small bowel loops also mildly inflamed.  This was question appendicitis versus inflammatory ileitis such as Crohn's disease.  She was treated with antibiotics and also had a fluid collection/abscess that she had a drain placed.  Symptoms completely resolved with all of that.  She had a repeat scan on 01/30/2022.  This showed complete resolution of the prior inflammation and abscess.  She is planned to undergo appendectomy on August 17 by Dr. Johney Maine.  He is requesting colonoscopy the day prior to her surgery so that she will be prepped for that.  He would like [place] to rule out any sign of Crohn's disease before proceeding.  She says that she did not have any abdominal pain at all for a couple days last week.  Now for the past few days she has had some diffuse lower abdominal discomfort.  Has been having some mild constipation since all of this, but met with a nutritionist and has been using smooth move tea which has helped.   Past Medical History:  Diagnosis Date   Arthritis    Colon polyps    Hypercholesterolemia     Intention tremor    Right   Kidney stones    Menopause    Osteopenia    Vitamin D deficiency    Past Surgical History:  Procedure Laterality Date   EXTRACORPOREAL SHOCK WAVE LITHOTRIPSY Left 02/20/2020   Procedure: EXTRACORPOREAL SHOCK WAVE LITHOTRIPSY (ESWL);  Surgeon: Raynelle Bring, MD;  Location: [facility];  Service: Urology;  Laterality: Left;   IR RADIOLOGIST EVAL & MGMT  01/30/2022   LEG SURGERY  1988-1990   s/p fracture    reports that she has never smoked. She has never used smokeless tobacco. She reports that she does not drink alcohol and does not use drugs. family history includes Breast cancer in her maternal aunt; Diabetes in her maternal grandfather; Fibromyalgia in her brother. No Known Allergies    Outpatient Encounter Medications as of 02/26/2022  Medication Sig   alendronate (FOSAMAX) 70 MG tablet Take 70 mg by mouth once a week.   aspirin 81 MG chewable tablet Chew 81 mg by mouth daily.   B Complex Vitamins (B COMPLEX 100 PO) Take 1 tablet by mouth daily.   Biotin 1 MG CAPS Take 1 mg by mouth daily.   Calcium-Phosphorus-Vitamin D (CITRACAL +D3 PO) Take 1 tablet by mouth daily.   Cholecalciferol (VITAMIN D) 125 MCG (5000 UT) CAPS Take 5,000 Units by mouth daily.   DULoxetine (CYMBALTA) 20 MG capsule  Take 20 mg by mouth daily.   ibuprofen (ADVIL) 200 MG tablet Take 200-400 mg by mouth every 6 (six) hours as needed for moderate pain.   Lecithin 1200 MG CAPS Take 1,200 mg by mouth daily.   Lutein 40 MG CAPS Take 40 mg by mouth daily.   magnesium 30 MG tablet Take 30 mg by mouth daily.   magnesium hydroxide (MILK OF MAGNESIA) 400 MG/5ML suspension Take 30 mLs by mouth daily as needed for mild constipation.   Omega-3 Fatty Acids (FISH OIL) 1000 MG CAPS Take 1,000 mg by mouth daily.   oxyCODONE (OXY IR/ROXICODONE) 5 MG immediate release tablet Take 1 tablet (5 mg total) by mouth every 6 (six) hours as needed for severe pain.   psyllium (METAMUCIL) 58.6 %  powder Take 1 packet by mouth daily as needed (constipation).   Sodium Chloride Flush (NORMAL SALINE FLUSH) 0.9 % SOLN Flush pelvic drain with 5 mls sterile saline once daily as directed   Turmeric 500 MG CAPS Take 500 mg by mouth daily.   vitamin B-12 (CYANOCOBALAMIN) 1000 MCG tablet Take 1,000 mcg by mouth daily.   vitamin C (ASCORBIC ACID) 500 MG tablet Take 500 mg by mouth daily.   No facility-administered encounter medications on file as of 02/26/2022.     REVIEW OF SYSTEMS  : All other systems reviewed and negative except where noted in the History of Present Illness.   PHYSICAL EXAM: BP 130/60   Pulse 75   Ht '5\' 1"'  (1.549 m)   Wt 128 lb (58.1 kg)   BMI 24.19 kg/m  General: Well developed white female in no acute distress Head: Normocephalic and atraumatic Eyes:  Sclerae anicteric, conjunctiva pink. Ears: Normal auditory acuity Lungs: Clear throughout to auscultation; no W/R/R. Heart: Regular rate and rhythm; no M/R/G. Abdomen: Soft, non-distended.  BS present.  Mild lower abdominal TTP. Rectal:  Will be done at the time of the colonoscopy. Musculoskeletal: Symmetrical with no gross deformities  Skin: No lesions on visible extremities Extremities: No edema  Neurological: Alert oriented x 4, grossly non-focal Psychological:  Alert and cooperative. Normal mood and affect  ASSESSMENT AND PLAN: *71 year old female with abnormal CT scan showing appendicitis versus ileitis.  She had a fluid collection/abscess for which she had a drain placed.  Symptoms completely resolved with antibiotics and drain.  Plan is going to be for appendectomy, but they want colonoscopy prior to that to rule out other causes such as Crohn's ileitis, etc.  Her surgery is scheduled for August 17.  We will plan for colonoscopy the day prior as per Dr. Clyda Greener request, on August 16 with Dr. Hilarie Fredrickson.  Her symptoms were very sudden in onset, much more like an appendicitis as she had no symptoms leading up to that  that would indicate Crohn's disease.  The risks, benefits, and alternatives to colonoscopy were discussed with the patient and she consents to proceed.  In the interim she will contact Dr. Clyda Greener office and our office for any persistent or worsening abdominal pain  **We will try to request previous colonoscopy records from Dr. Earlean Shawl.   CC:  Meuth, Blaine Hamper, PA-C

## 2022-02-26 NOTE — Patient Instructions (Signed)
If you are age 73 or older, your body mass index should be between 23-30. Your Body mass index is 24.19 kg/m. If this is out of the aforementioned range listed, please consider follow up with your Primary Care Provider.  If you are age 35 or younger, your body mass index should be between 19-25. Your Body mass index is 24.19 kg/m. If this is out of the aformentioned range listed, please consider follow up with your Primary Care Provider.   ________________________________________________________  The [facility] providers would like to encourage you to use [facility] to communicate with providers for non-urgent requests or questions.  Due to long hold times on the telephone, sending your provider a message by [facility] may be a faster and more efficient way to get a response.  Please allow 48 business hours for a response.  Please remember that this is for non-urgent requests.  _______________________________________________________  Dennis Bast have been scheduled for a colonoscopy. Please follow written instructions given to you at your visit today.  Please pick up your prep supplies at the pharmacy within the next 1-3 days. If you use inhalers (even only as needed), please bring them with you on the day of your procedure.

## 2022-02-26 NOTE — Progress Notes (Signed)
Addendum: Reviewed and agree with assessment and management plan. Brendin Situ M, MD  

## 2022-03-21 ENCOUNTER — Other Ambulatory Visit (HOSPITAL_COMMUNITY): Payer: Self-pay

## 2022-03-28 NOTE — Patient Instructions (Signed)
DUE TO COVID-19 ONLY TWO VISITORS  (aged 73 and older)  ARE ALLOWED TO COME WITH YOU AND STAY IN THE WAITING ROOM ONLY DURING PRE OP AND PROCEDURE.   **NO VISITORS ARE ALLOWED IN THE SHORT STAY AREA OR RECOVERY ROOM!!**  IF YOU WILL BE ADMITTED INTO THE HOSPITAL YOU ARE ALLOWED ONLY FOUR SUPPORT PEOPLE DURING VISITATION HOURS ONLY (7 AM -8PM)   The support person(s) must pass our screening, gel in and out, and wear a mask at all times, including in the patient's room. Patients must also wear a mask when staff or their support person are in the room. Visitors GUEST BADGE MUST BE WORN VISIBLY  One adult visitor may remain with you overnight and MUST be in the room by 8 P.M.     Your procedure is scheduled on: 04/10/22   Report to [facility] Main Entrance    Report to admitting at : 8:45 AM   Call this number if you have problems the morning of surgery [phone]   Clear liquids starting the day before surgery until : 8:00 AM DAY OF SURGERY. Drink plenty fluids the day of the prep.  Water Black Coffee (sugar ok, NO MILK/CREAM OR CREAMERS)  Tea (sugar ok, NO MILK/CREAM OR CREAMERS) regular and decaf                             Plain Jell-O (NO RED)                                           Fruit ices (not with fruit pulp, NO RED)                                     Popsicles (NO RED)                                                                  Juice: apple, WHITE grape, WHITE cranberry Sports drinks like Gatorade (NO RED)              DRINK 2 PRESURGERY ENSURE DRINKS THE NIGHT BEFORE SURGERY AT  1000 PM AND 1 PRESURGERY DRINK THE DAY OF THE PROCEDURE 3 HOURS PRIOR TO SCHEDULED SURGERY. NO SOLIDS AFTER MIDNIGHT THE DAY PRIOR TO THE SURGERY. NOTHING BY MOUTH EXCEPT CLEAR LIQUIDS UNTIL THREE HOURS PRIOR TO SCHEDULED SURGERY. PLEASE FINISH PRESURGERY ENSURE DRINK PER SURGEON ORDER 3 HOURS PRIOR TO SCHEDULED SURGERY TIME WHICH NEEDS TO BE COMPLETED AT : 8:00 AM.    The day of  surgery:  Drink ONE (1) Pre-Surgery Clear Ensure or G2 at AM the morning of surgery. Drink in one sitting. Do not sip.  This drink was given to you during your hospital  pre-op appointment visit. Nothing else to drink after completing the  Pre-Surgery Clear Ensure or G2.          If you have questions, please contact your surgeon's office.  FOLLOW BOWEL PREP AND ANY ADDITIONAL PRE OP INSTRUCTIONS YOU RECEIVED FROM YOUR SURGEON'S OFFICE!!!  Oral Hygiene is also important to reduce your risk of infection.                                    Remember - BRUSH YOUR TEETH THE MORNING OF SURGERY WITH YOUR REGULAR TOOTHPASTE   Do NOT smoke after Midnight   Take these medicines the morning of surgery with A SIP OF WATER: duloxetine.  DO NOT TAKE ANY ORAL DIABETIC MEDICATIONS DAY OF YOUR SURGERY  Bring CPAP mask and tubing day of surgery.                              You may not have any metal on your body including hair pins, jewelry, and body piercing             Do not wear make-up, lotions, powders, perfumes/cologne, or deodorant  Do not wear nail polish including gel and S&S, artificial/acrylic nails, or any other type of covering on natural nails including finger and toenails. If you have artificial nails, gel coating, etc. that needs to be removed by a nail salon please have this removed prior to surgery or surgery may need to be canceled/ delayed if the surgeon/ anesthesia feels like they are unable to be safely monitored.   Do not shave  48 hours prior to surgery.    Do not bring valuables to the hospital. [place].   Contacts, dentures or bridgework may not be worn into surgery.   Bring small overnight bag day of surgery.   DO NOT [place]. PHARMACY WILL DISPENSE MEDICATIONS LISTED ON YOUR MEDICATION LIST TO YOU DURING YOUR ADMISSION [place]!    Patients discharged on the day of  surgery will not be allowed to drive home.  Someone NEEDS to stay with you for the first 24 hours after anesthesia.   Special Instructions: Bring a copy of your healthcare power of attorney and living will documents         the day of surgery if you haven't scanned them before.              Please read over the following fact sheets you were given: IF YOU HAVE QUESTIONS ABOUT YOUR PRE-OP INSTRUCTIONS PLEASE CALL [phone]     [facility] - Preparing for Surgery Before surgery, you can play an important role.  Because skin is not sterile, your skin needs to be as free of germs as possible.  You can reduce the number of germs on your skin by washing with CHG (chlorahexidine gluconate) soap before surgery.  CHG is an antiseptic cleaner which kills germs and bonds with the skin to continue killing germs even after washing. Please DO NOT use if you have an allergy to CHG or antibacterial soaps.  If your skin becomes reddened/irritated stop using the CHG and inform your nurse when you arrive at Short Stay. Do not shave (including legs and underarms) for at least 48 hours prior to the first CHG shower.  You may shave your face/neck. Please follow these instructions carefully:  1.  Shower with CHG Soap the night before surgery and the  morning of Surgery.  2.  If you choose to wash your hair, wash your hair first  as usual with your  normal  shampoo.  3.  After you shampoo, rinse your hair and body thoroughly to remove the  shampoo.                           4.  Use CHG as you would any other liquid soap.  You can apply chg directly  to the skin and wash                       Gently with a scrungie or clean washcloth.  5.  Apply the CHG Soap to your body ONLY FROM THE NECK DOWN.   Do not use on face/ open                           Wound or open sores. Avoid contact with eyes, ears mouth and genitals (private parts).                       Wash face,  Genitals (private parts) with your normal soap.              6.  Wash thoroughly, paying special attention to the area where your surgery  will be performed.  7.  Thoroughly rinse your body with warm water from the neck down.  8.  DO NOT shower/wash with your normal soap after using and rinsing off  the CHG Soap.                9.  Pat yourself dry with a clean towel.            10.  Wear clean pajamas.            11.  Place clean sheets on your bed the night of your first shower and do not  sleep with pets. Day of Surgery : Do not apply any lotions/deodorants the morning of surgery.  Please wear clean clothes to the hospital/surgery center.  FAILURE TO FOLLOW THESE INSTRUCTIONS MAY RESULT IN THE CANCELLATION OF YOUR SURGERY PATIENT SIGNATURE_________________________________  NURSE SIGNATURE__________________________________  ________________________________________________________________________   Alexis Holmes  An incentive spirometer is a tool that can help keep your lungs clear and active. This tool measures how well you are filling your lungs with each breath. Taking long deep breaths may help reverse or decrease the chance of developing breathing (pulmonary) problems (especially infection) following: A long period of time when you are unable to move or be active. BEFORE THE PROCEDURE  If the spirometer includes an indicator to show your best effort, your nurse or respiratory therapist will set it to a desired goal. If possible, sit up straight or lean slightly forward. Try not to slouch. Hold the incentive spirometer in an upright position. INSTRUCTIONS FOR USE  Sit on the edge of your bed if possible, or sit up as far as you can in bed or on a chair. Hold the incentive spirometer in an upright position. Breathe out normally. Place the mouthpiece in your mouth and seal your lips tightly around it. Breathe in slowly and as deeply as possible, raising the piston or the ball toward the top of the column. Hold your breath for 3-5  seconds or for as long as possible. Allow the piston or ball to fall to the bottom of the column. Remove the mouthpiece from your mouth and breathe out normally. Rest for a  few seconds and repeat Steps 1 through 7 at least 10 times every 1-2 hours when you are awake. Take your time and take a few normal breaths between deep breaths. The spirometer may include an indicator to show your best effort. Use the indicator as a goal to work toward during each repetition. After each set of 10 deep breaths, practice coughing to be sure your lungs are clear. If you have an incision (the cut made at the time of surgery), support your incision when coughing by placing a pillow or rolled up towels firmly against it. Once you are able to get out of bed, walk around indoors and cough well. You may stop using the incentive spirometer when instructed by your caregiver.  RISKS AND COMPLICATIONS Take your time so you do not get dizzy or light-headed. If you are in pain, you may need to take or ask for pain medication before doing incentive spirometry. It is harder to take a deep breath if you are having pain. AFTER USE Rest and breathe slowly and easily. It can be helpful to keep track of a log of your progress. Your caregiver can provide you with a simple table to help with this. If you are using the spirometer at home, follow these instructions: [place] IF:  You are having difficultly using the spirometer. You have trouble using the spirometer as often as instructed. Your pain medication is not giving enough relief while using the spirometer. You develop fever of 100.5 F (38.1 C) or higher. SEEK IMMEDIATE MEDICAL CARE IF:  You cough up bloody sputum that had not been present before. You develop fever of 102 F (38.9 C) or greater. You develop worsening pain at or near the incision site. MAKE SURE YOU:  Understand these instructions. Will watch your condition. Will get help right away if you are  not doing well or get worse. Document Released: 12/22/2006 Document Revised: 11/03/2011 Document Reviewed: 02/22/2007 [facility] Patient Information 2014 [place], [place].   ________________________________________________________________________

## 2022-03-31 ENCOUNTER — Encounter (HOSPITAL_COMMUNITY)
Admission: RE | Admit: 2022-03-31 | Discharge: 2022-03-31 | Disposition: A | Discharge status: Home / Self Care | Payer: Medicare Other | Source: Ambulatory Visit | Attending: Family Medicine | Admitting: Family Medicine

## 2022-03-31 ENCOUNTER — Other Ambulatory Visit: Payer: Self-pay

## 2022-03-31 ENCOUNTER — Encounter (HOSPITAL_COMMUNITY): Payer: Self-pay

## 2022-03-31 VITALS — BP 114/70 | HR 76 | Temp 98.2°F | Ht 61.0 in | Wt 127.0 lb

## 2022-03-31 DIAGNOSIS — Z01818 Encounter for other preprocedural examination: Secondary | ICD-10-CM

## 2022-03-31 DIAGNOSIS — R739 Hyperglycemia, unspecified: Secondary | ICD-10-CM

## 2022-03-31 DIAGNOSIS — Z01812 Encounter for preprocedural laboratory examination: Secondary | ICD-10-CM | POA: Diagnosis present

## 2022-03-31 HISTORY — DX: Anxiety disorder, unspecified: F41.9

## 2022-03-31 HISTORY — DX: Personal history of urinary calculi: Z87.442

## 2022-03-31 HISTORY — DX: Depression, unspecified: F32.A

## 2022-03-31 HISTORY — DX: Anemia, unspecified: D64.9

## 2022-03-31 LAB — CBC
HCT: 39 % (ref 36.0–46.0)
Hemoglobin: 13 g/dL (ref 12.0–15.0)
MCH: 30.7 pg (ref 26.0–34.0)
MCHC: 33.3 g/dL (ref 30.0–36.0)
MCV: 92 fL (ref 80.0–100.0)
Platelets: 326 10*3/uL (ref 150–400)
RBC: 4.24 MIL/uL (ref 3.87–5.11)
RDW: 12.6 % (ref 11.5–15.5)
WBC: 7.2 10*3/uL (ref 4.0–10.5)
nRBC: 0 % (ref 0.0–0.2)

## 2022-03-31 LAB — HEMOGLOBIN A1C
Hgb A1c MFr Bld: 5 % (ref 4.8–5.6)
Mean Plasma Glucose: 96.8 mg/dL

## 2022-03-31 NOTE — Progress Notes (Signed)
For Short Stay: [place] appointment date: Date of COVID positive in last 46 days:  Bowel Prep reminder:   For Anesthesia: PCP - Thedora Hinders: FNP  Cardiologist -   Chest x-ray - 01/13/22 EKG -  Stress Test -  ECHO -  Cardiac Cath -  Pacemaker/ICD device last checked: Pacemaker orders received: Device Rep notified:  Spinal Cord Stimulator:  Sleep Study -  CPAP -   Fasting Blood Sugar -  Checks Blood Sugar _____ times a day Date and result of last Hgb A1c-  Blood Thinner Instructions: Aspirin Instructions: Last Dose:  Activity level: Can go up a flight of stairs and activities of daily living without stopping and without chest pain and/or shortness of breath   Able to exercise without chest pain and/or shortness of breath   Unable to go up a flight of stairs without chest pain and/or shortness of breath     Anesthesia review:   Patient denies shortness of breath, fever, cough and chest pain at PAT appointment   Patient verbalized understanding of instructions that were given to them at the PAT appointment. Patient was also instructed that they will need to review over the PAT instructions again at home before surgery.

## 2022-04-03 ENCOUNTER — Encounter: Payer: Self-pay | Admitting: Certified Registered Nurse Anesthetist

## 2022-04-09 ENCOUNTER — Encounter: Payer: Self-pay | Admitting: Internal Medicine

## 2022-04-09 ENCOUNTER — Ambulatory Visit (AMBULATORY_SURGERY_CENTER): Payer: Medicare Other | Admitting: Internal Medicine

## 2022-04-09 ENCOUNTER — Encounter (HOSPITAL_COMMUNITY): Payer: Self-pay | Admitting: Surgery

## 2022-04-09 VITALS — BP 114/62 | HR 65 | Temp 97.8°F | Resp 12 | Ht 61.0 in | Wt 128.0 lb

## 2022-04-09 DIAGNOSIS — K529 Noninfective gastroenteritis and colitis, unspecified: Secondary | ICD-10-CM

## 2022-04-09 DIAGNOSIS — R935 Abnormal findings on diagnostic imaging of other abdominal regions, including retroperitoneum: Secondary | ICD-10-CM

## 2022-04-09 DIAGNOSIS — D122 Benign neoplasm of ascending colon: Secondary | ICD-10-CM

## 2022-04-09 DIAGNOSIS — D123 Benign neoplasm of transverse colon: Secondary | ICD-10-CM

## 2022-04-09 MED ORDER — SODIUM CHLORIDE 0.9 % IV SOLN: 500.0000 mL | Freq: Once | INTRAVENOUS | Status: DC

## 2022-04-09 NOTE — Progress Notes (Signed)
VS completed by CW.   Pt's states no medical or surgical changes since previsit or office visit.  

## 2022-04-09 NOTE — Progress Notes (Signed)
Report given to PACU, vss 

## 2022-04-09 NOTE — Progress Notes (Signed)
GASTROENTEROLOGY PROCEDURE H&P NOTE   Primary Care Physician: Holland Commons, FNP    Reason for Procedure:  Abnormal CT revealing appendicitis versus ileitis complicated by fluid collection status post drainage, plans for surgery tomorrow with Dr. Johney [place]  Plan:    Colonoscopy  Patient is appropriate for endoscopic procedure(s) in the ambulatory ([place]) setting.  The nature of the procedure, as well as the risks, benefits, and alternatives were carefully and thoroughly reviewed with the patient. Ample time for discussion and questions allowed. The patient understood, was satisfied, and agreed to proceed.     HPI: Alexis Holmes is a 71 y.o. female who presents for diagnostic colonoscopy.  Medical history as below.  Tolerated the prep.  No recent chest pain or shortness of breath.  No abdominal pain today.  Past Medical History:  Diagnosis Date   Anemia    Anxiety    Arthritis    Colon polyps    Depression    History of kidney stones    Hypercholesterolemia    Intention tremor    Right   Menopause    Osteopenia    Vitamin D deficiency     Past Surgical History:  Procedure Laterality Date   COLONOSCOPY     EXTRACORPOREAL SHOCK WAVE LITHOTRIPSY Left 02/20/2020   Procedure: EXTRACORPOREAL SHOCK WAVE LITHOTRIPSY (ESWL);  Surgeon: Raynelle Bring, MD;  Location: [facility];  Service: Urology;  Laterality: Left;   IR RADIOLOGIST EVAL & MGMT  01/30/2022   LEG SURGERY  1988-1990   s/p fracture    Prior to Admission medications   Medication Sig Start Date End Date Taking? Authorizing Provider  Cholecalciferol (VITAMIN D) 125 MCG (5000 UT) CAPS Take 5,000 Units by mouth daily.   Yes [provider]  DULoxetine (CYMBALTA) 20 MG capsule Take 20 mg by mouth daily.   Yes [provider]  Omega-3 Fatty Acids (FISH OIL) 1000 MG CAPS Take 1,000 mg by mouth daily.   Yes [provider]  SELENIUM PO Take 1 tablet by mouth daily.   Yes  [provider]  zinc gluconate 50 MG tablet Take 50 mg by mouth daily.   Yes [provider]  alendronate (FOSAMAX) 70 MG tablet Take 70 mg by mouth once a week. 02/26/21   [provider]  oxyCODONE (OXY IR/ROXICODONE) 5 MG immediate release tablet Take 1 tablet (5 mg total) by mouth every 6 (six) hours as needed for severe pain. Patient not taking: Reported on 03/20/2022 01/17/22   Wellington Hampshire, PA-C  Sodium Chloride Flush (NORMAL SALINE FLUSH) 0.9 % SOLN Flush pelvic drain with 5 mls sterile saline once daily as directed Patient not taking: Reported on 03/20/2022 01/17/22   Allred, Shirlyn Goltz, PA-C    Current Outpatient Medications  Medication Sig Dispense Refill   Cholecalciferol (VITAMIN D) 125 MCG (5000 UT) CAPS Take 5,000 Units by mouth daily.     DULoxetine (CYMBALTA) 20 MG capsule Take 20 mg by mouth daily.     Omega-3 Fatty Acids (FISH OIL) 1000 MG CAPS Take 1,000 mg by mouth daily.     SELENIUM PO Take 1 tablet by mouth daily.     zinc gluconate 50 MG tablet Take 50 mg by mouth daily.     alendronate (FOSAMAX) 70 MG tablet Take 70 mg by mouth once a week.     oxyCODONE (OXY IR/ROXICODONE) 5 MG immediate release tablet Take 1 tablet (5 mg total) by mouth every 6 (six) hours as  needed for severe pain. (Patient not taking: Reported on 03/20/2022) 15 tablet 0   Sodium Chloride Flush (NORMAL SALINE FLUSH) 0.9 % SOLN Flush pelvic drain with 5 mls sterile saline once daily as directed (Patient not taking: Reported on 03/20/2022) 300 mL 3   Current Facility-Administered Medications  Medication Dose Route Frequency Provider Last Rate Last Admin   0.9 %  sodium chloride infusion  500 mL Intravenous Once Alan Riles, Lajuan Lines, MD        Allergies as of 04/09/2022   (No Known Allergies)    Family History  Problem Relation Age of Onset   Fibromyalgia Brother    Diabetes Maternal Grandfather    Breast cancer Maternal Aunt     Social History   Socioeconomic History    Marital status: Divorced    Spouse name: Not on file   Number of children: 0   Years of education: Assoc deg   Highest education level: Not on file  Occupational History   Occupation: [employer]    Employer: Office manager PA   Occupation: retired  Tobacco Use   Smoking status: Never   Smokeless tobacco: Never  Scientific laboratory technician Use: Never used  Substance and Sexual Activity   Alcohol use: No   Drug use: No   Sexual activity: Yes    Partners: Female  Other Topics Concern   Not on file  Social History Narrative   Not on file   Social Determinants of Health   Financial Resource Strain: Not on file  Food Insecurity: Not on file  Transportation Needs: Not on file  Physical Activity: Not on file  Stress: Not on file  Social Connections: Not on file  Intimate Partner Violence: Not on file    Physical Exam: Vital signs in last 24 hours: '@BP'$  137/76   Pulse 68   Temp 97.8 F (36.6 C) (Temporal)   Ht '5\' 1"'$  (1.549 m)   Wt 128 lb (58.1 kg)   SpO2 100%   BMI 24.19 kg/m  GEN: NAD EYE: Sclerae anicteric ENT: MMM CV: Non-tachycardic Pulm: CTA b/l GI: Soft, NT/ND NEURO:  Alert & Oriented x 3   Zenovia Jarred, MD [facility]  04/09/2022 9:03 AM

## 2022-04-09 NOTE — Progress Notes (Signed)
Called to room to assist during endoscopic procedure.  Patient ID and intended procedure confirmed with present staff. Received instructions for my participation in the procedure from the performing physician.  

## 2022-04-09 NOTE — Op Note (Addendum)
[place] Patient Name: Alexis Holmes Procedure Date: 04/09/2022 8:58 AM MRN: [id number] Endoscopist: Jerene Bears , MD Age: 71 Referring MD:  Date of Birth: [birth year] Gender: Female Account #: [id number] Procedure:                Colonoscopy Indications:              Abnormal CT of the GI tract; revealing ileitis and                            possible appendicitis complicated by abscess                            requiring drain placement in May 2023. Plans for                            surgery tomorrow with Dr. Johney [place]. Medicines:                Monitored Anesthesia Care Procedure:                Pre-Anesthesia Assessment:                           - Prior to the procedure, a History and Physical                            was performed, and patient medications and                            allergies were reviewed. The patient's tolerance of                            previous anesthesia was also reviewed. The risks                            and benefits of the procedure and the sedation                            options and risks were discussed with the patient.                            All questions were answered, and informed consent                            was obtained. Prior Anticoagulants: The patient has                            taken no previous anticoagulant or antiplatelet                            agents. ASA Grade Assessment: III - A patient with                            severe systemic disease. After reviewing the risks  and benefits, the patient was deemed in                            satisfactory condition to undergo the procedure.                           After obtaining informed consent, the colonoscope                            was passed under direct vision. Throughout the                            procedure, the patient's blood pressure, pulse, and                            oxygen saturations were  monitored continuously. The                            Olympus CF-[id number]) Colonoscope was                            introduced through the anus and advanced to the                            terminal ileum. The colonoscopy was performed                            without difficulty. The patient tolerated the                            procedure well. The quality of the bowel                            preparation was good. The terminal ileum, ileocecal                            valve, appendiceal orifice, and rectum were                            photographed. The Olympus [id number]-[id number] (#[id number])                            Colonoscope was introduced through the anus and                            advanced to the terminal ileum. Scope In: 9:12:17 AM Scope Out: 9:31:20 AM Scope Withdrawal Time: 0 hours 12 minutes 42 seconds  Total Procedure Duration: 0 hours 19 minutes 3 seconds  Findings:                 The digital rectal exam was normal.                           The terminal ileum appeared normal.  Two sessile polyps were found in the ascending                            colon. The polyps were 4 to 5 mm in size. These                            polyps were removed with a cold snare. Resection                            and retrieval were complete.                           A 4 mm polyp was found in the proximal transverse                            colon. The polyp was sessile. The polyp was removed                            with a cold snare. Resection and retrieval were                            complete.                           The exam was otherwise without abnormality on                            direct and retroflexion views. Complications:            No immediate complications. Estimated Blood Loss:     Estimated blood loss was minimal. Impression:               - The examined portion of the ileum was normal. No                             evidence of ileitis or Crohn's disease.                           - Two 4 to 5 mm polyps in the ascending colon,                            removed with a cold snare. Resected and retrieved.                           - One 4 mm polyp in the proximal transverse colon,                            removed with a cold snare. Resected and retrieved.                           - The examination was otherwise normal on direct  and retroflexion views. No evidence of colitis. Recommendation:           - Patient has a contact number available for                            emergencies. The signs and symptoms of potential                            delayed complications were discussed with the                            patient. Return to normal activities tomorrow.                            Written discharge instructions were provided to the                            patient.                           - Resume previous diet.                           - Continue present medications.                           - Await pathology results.                           - Repeat colonoscopy may be recommended. The                            colonoscopy date will be determined after pathology                            results from today's exam become available for                            review. Jerene Bears, MD 04/09/2022 9:01:57 AM This report has been signed electronically.

## 2022-04-09 NOTE — Patient Instructions (Addendum)
Handouts provided on polyps.   YOU HAD AN ENDOSCOPIC PROCEDURE TODAY AT [facility]:   Refer to the procedure report that was given to you for any specific questions about what was found during the examination.  If the procedure report does not answer your questions, please call your gastroenterologist to clarify.  If you requested that your care partner not be given the details of your procedure findings, then the procedure report has been included in a sealed envelope for you to review at your convenience later.  YOU SHOULD EXPECT: Some feelings of bloating in the abdomen. Passage of more gas than usual.  Walking can help get rid of the air that was put into your GI tract during the procedure and reduce the bloating. If you had a lower endoscopy (such as a colonoscopy or flexible sigmoidoscopy) you may notice spotting of blood in your stool or on the toilet paper. If you underwent a bowel prep for your procedure, you may not have a normal bowel movement for a few days.  Please Note:  You might notice some irritation and congestion in your nose or some drainage.  This is from the oxygen used during your procedure.  There is no need for concern and it should clear up in a day or so.  SYMPTOMS TO REPORT IMMEDIATELY:  Following lower endoscopy (colonoscopy or flexible sigmoidoscopy):  Excessive amounts of blood in the stool  Significant tenderness or worsening of abdominal pains  Swelling of the abdomen that is new, acute  Fever of 100F or higher  For urgent or emergent issues, a gastroenterologist can be reached at any hour by calling [phone]. Do not use MyChart messaging for urgent concerns.    DIET:  Stay on Clear Liquids today per orders from surgeon for planned surgery tomorrow. Drink plenty of fluids but you should avoid alcoholic beverages for 24 hours.  ACTIVITY:  You should plan to take it easy for the rest of today and you should NOT DRIVE or use heavy machinery  until tomorrow (because of the sedation medicines used during the test).    FOLLOW UP: Our staff will call the number listed on your records the next business day following your procedure.  We will call around 7:15- 8:00 am to check on you and address any questions or concerns that you may have regarding the information given to you following your procedure. If we do not reach you, we will leave a message.  If you develop any symptoms (ie: fever, flu-like symptoms, shortness of breath, cough etc.) before then, please call [phone].  If you test positive for Covid 19 in the 2 weeks post procedure, please call and report this information to [place].    If any biopsies were taken you will be contacted by phone or by letter within the next 1-3 weeks.  Please call us at [phone] if you have not heard about the biopsies in 3 weeks.    SIGNATURES/CONFIDENTIALITY: You and/or your care partner have signed paperwork which will be entered into your electronic medical record.  These signatures attest to the fact that that the information above on your After Visit Summary has been reviewed and is understood.  Full responsibility of the confidentiality of this discharge information lies with you and/or your care-partner.

## 2022-04-10 ENCOUNTER — Encounter (HOSPITAL_COMMUNITY): Payer: Self-pay | Admitting: Surgery

## 2022-04-10 ENCOUNTER — Encounter (HOSPITAL_COMMUNITY)
Admission: RE | Disposition: A | Discharge status: Home / Self Care | Payer: Self-pay | Source: Ambulatory Visit | Attending: Surgery

## 2022-04-10 ENCOUNTER — Ambulatory Visit (HOSPITAL_COMMUNITY)
Admission: RE | Admit: 2022-04-10 | Discharge: 2022-04-10 | Disposition: A | Discharge status: Home / Self Care | Payer: Medicare Other | Source: Ambulatory Visit | Attending: Surgery | Admitting: Surgery

## 2022-04-10 ENCOUNTER — Telehealth: Payer: Self-pay

## 2022-04-10 ENCOUNTER — Other Ambulatory Visit (HOSPITAL_COMMUNITY): Payer: Self-pay

## 2022-04-10 ENCOUNTER — Inpatient Hospital Stay (HOSPITAL_COMMUNITY): Payer: Medicare Other | Admitting: Anesthesiology

## 2022-04-10 ENCOUNTER — Other Ambulatory Visit: Payer: Self-pay

## 2022-04-10 DIAGNOSIS — K3532 Acute appendicitis with perforation and localized peritonitis, without abscess: Secondary | ICD-10-CM

## 2022-04-10 DIAGNOSIS — K5730 Diverticulosis of large intestine without perforation or abscess without bleeding: Secondary | ICD-10-CM | POA: Diagnosis not present

## 2022-04-10 DIAGNOSIS — K3533 Acute appendicitis with perforation and localized peritonitis, with abscess: Secondary | ICD-10-CM | POA: Diagnosis not present

## 2022-04-10 DIAGNOSIS — N739 Female pelvic inflammatory disease, unspecified: Secondary | ICD-10-CM | POA: Diagnosis present

## 2022-04-10 DIAGNOSIS — N736 Female pelvic peritoneal adhesions (postinfective): Secondary | ICD-10-CM | POA: Diagnosis not present

## 2022-04-10 HISTORY — PX: LAPAROSCOPIC APPENDECTOMY: SHX408

## 2022-04-10 LAB — TYPE AND SCREEN
ABO/RH(D): A POS
Antibody Screen: NEGATIVE

## 2022-04-10 LAB — ABO/RH: ABO/RH(D): A POS

## 2022-04-10 SURGERY — APPENDECTOMY, LAPAROSCOPIC
Anesthesia: General

## 2022-04-10 MED ORDER — 0.9 % SODIUM CHLORIDE (POUR BTL) OPTIME
TOPICAL | Status: DC | PRN
  Administered 2022-04-10: 1000 mL

## 2022-04-10 MED ORDER — ENSURE PRE-SURGERY PO LIQD
592.0000 mL | Freq: Once | ORAL | Status: DC
  Filled 2022-04-10: qty 592

## 2022-04-10 MED ORDER — LIDOCAINE 2% (20 MG/ML) 5 ML SYRINGE
INTRAMUSCULAR | Status: DC | PRN
  Administered 2022-04-10: 100 mg via INTRAVENOUS

## 2022-04-10 MED ORDER — NEOMYCIN SULFATE 500 MG PO TABS: 1000.0000 mg | ORAL_TABLET | ORAL | Status: DC

## 2022-04-10 MED ORDER — GABAPENTIN 300 MG PO CAPS
300.0000 mg | ORAL_CAPSULE | ORAL | Status: AC
  Administered 2022-04-10: 300 mg via ORAL
  Filled 2022-04-10: qty 1

## 2022-04-10 MED ORDER — EPHEDRINE 5 MG/ML INJ
INTRAVENOUS | Status: AC
  Filled 2022-04-10: qty 5

## 2022-04-10 MED ORDER — ACETAMINOPHEN 500 MG PO TABS
1000.0000 mg | ORAL_TABLET | ORAL | Status: AC
  Administered 2022-04-10: 1000 mg via ORAL
  Filled 2022-04-10: qty 2

## 2022-04-10 MED ORDER — BUPIVACAINE-EPINEPHRINE (PF) 0.5% -1:200000 IJ SOLN
INTRAMUSCULAR | Status: DC | PRN
  Administered 2022-04-10: 30 mL

## 2022-04-10 MED ORDER — KETOROLAC TROMETHAMINE 30 MG/ML IJ SOLN
30.0000 mg | Freq: Once | INTRAMUSCULAR | Status: AC | PRN
Start: 2022-04-10 — End: 2022-04-10
  Administered 2022-04-10: 30 mg via INTRAVENOUS

## 2022-04-10 MED ORDER — ONDANSETRON HCL 4 MG/2ML IJ SOLN
INTRAMUSCULAR | Status: DC | PRN
  Administered 2022-04-10: 4 mg via INTRAVENOUS

## 2022-04-10 MED ORDER — LIDOCAINE 2% (20 MG/ML) 5 ML SYRINGE
INTRAMUSCULAR | Status: AC
  Filled 2022-04-10: qty 5

## 2022-04-10 MED ORDER — ENSURE PRE-SURGERY PO LIQD
296.0000 mL | Freq: Once | ORAL | Status: DC
  Filled 2022-04-10: qty 296

## 2022-04-10 MED ORDER — SUGAMMADEX SODIUM 200 MG/2ML IV SOLN
INTRAVENOUS | Status: DC | PRN
  Administered 2022-04-10: 155 mg via INTRAVENOUS

## 2022-04-10 MED ORDER — ENOXAPARIN SODIUM 40 MG/0.4ML IJ SOSY
40.0000 mg | PREFILLED_SYRINGE | Freq: Once | INTRAMUSCULAR | Status: AC
  Administered 2022-04-10: 40 mg via SUBCUTANEOUS
  Filled 2022-04-10: qty 0.4

## 2022-04-10 MED ORDER — ALVIMOPAN 12 MG PO CAPS
12.0000 mg | ORAL_CAPSULE | ORAL | Status: AC
  Administered 2022-04-10: 12 mg via ORAL
  Filled 2022-04-10: qty 1

## 2022-04-10 MED ORDER — ROCURONIUM BROMIDE 10 MG/ML (PF) SYRINGE
PREFILLED_SYRINGE | INTRAVENOUS | Status: AC
  Filled 2022-04-10: qty 10

## 2022-04-10 MED ORDER — SODIUM CHLORIDE 0.9 % IV SOLN
2.0000 g | INTRAVENOUS | Status: AC
  Administered 2022-04-10: 2 g via INTRAVENOUS
  Filled 2022-04-10: qty 2

## 2022-04-10 MED ORDER — LACTATED RINGERS IR SOLN
Status: DC | PRN
  Administered 2022-04-10: 1000 mL

## 2022-04-10 MED ORDER — FENTANYL CITRATE PF 50 MCG/ML IJ SOSY: 25.0000 ug | PREFILLED_SYRINGE | INTRAMUSCULAR | Status: DC | PRN

## 2022-04-10 MED ORDER — BISACODYL 5 MG PO TBEC: 20.0000 mg | DELAYED_RELEASE_TABLET | Freq: Once | ORAL | Status: DC

## 2022-04-10 MED ORDER — DEXAMETHASONE SODIUM PHOSPHATE 10 MG/ML IJ SOLN
INTRAMUSCULAR | Status: DC | PRN
  Administered 2022-04-10: 4 mg via INTRAVENOUS

## 2022-04-10 MED ORDER — BUPIVACAINE LIPOSOME 1.3 % IJ SUSP: 20.0000 mL | Freq: Once | INTRAMUSCULAR | Status: DC

## 2022-04-10 MED ORDER — PHENYLEPHRINE HCL-NACL 20-0.9 MG/250ML-% IV SOLN
INTRAVENOUS | Status: DC | PRN
  Administered 2022-04-10: 80 ug/min via INTRAVENOUS

## 2022-04-10 MED ORDER — BUPIVACAINE LIPOSOME 1.3 % IJ SUSP
INTRAMUSCULAR | Status: DC | PRN
  Administered 2022-04-10: 20 mL

## 2022-04-10 MED ORDER — OXYCODONE HCL 5 MG/5ML PO SOLN: 5.0000 mg | Freq: Once | ORAL | Status: DC | PRN

## 2022-04-10 MED ORDER — FENTANYL CITRATE (PF) 100 MCG/2ML IJ SOLN
INTRAMUSCULAR | Status: AC
  Filled 2022-04-10: qty 2

## 2022-04-10 MED ORDER — CHLORHEXIDINE GLUCONATE 0.12 % MT SOLN: 15.0000 mL | Freq: Once | OROMUCOSAL | Status: DC

## 2022-04-10 MED ORDER — LIDOCAINE HCL (PF) 2 % IJ SOLN
INTRAMUSCULAR | Status: DC | PRN
  Administered 2022-04-10: 1.5 mg/kg/h via INTRADERMAL

## 2022-04-10 MED ORDER — KETOROLAC TROMETHAMINE 30 MG/ML IJ SOLN
INTRAMUSCULAR | Status: AC
  Filled 2022-04-10: qty 1

## 2022-04-10 MED ORDER — ROCURONIUM BROMIDE 10 MG/ML (PF) SYRINGE
PREFILLED_SYRINGE | INTRAVENOUS | Status: DC | PRN
  Administered 2022-04-10: 60 mg via INTRAVENOUS

## 2022-04-10 MED ORDER — METRONIDAZOLE 500 MG PO TABS: 1000.0000 mg | ORAL_TABLET | ORAL | Status: DC

## 2022-04-10 MED ORDER — TRAMADOL HCL 50 MG PO TABS
50.0000 mg | ORAL_TABLET | Freq: Four times a day (QID) | ORAL | 0 refills | Status: DC | PRN
  Filled 2022-04-10: qty 20, 3d supply, fill #0

## 2022-04-10 MED ORDER — PROMETHAZINE HCL 25 MG/ML IJ SOLN: 6.2500 mg | INTRAMUSCULAR | Status: DC | PRN

## 2022-04-10 MED ORDER — PROMETHAZINE HCL 25 MG/ML IJ SOLN
INTRAMUSCULAR | Status: AC
  Filled 2022-04-10: qty 1

## 2022-04-10 MED ORDER — POLYETHYLENE GLYCOL 3350 17 GM/SCOOP PO POWD: 1.0000 | Freq: Once | ORAL | Status: DC

## 2022-04-10 MED ORDER — BUPIVACAINE LIPOSOME 1.3 % IJ SUSP
INTRAMUSCULAR | Status: AC
  Filled 2022-04-10: qty 20

## 2022-04-10 MED ORDER — PROPOFOL 10 MG/ML IV BOLUS
INTRAVENOUS | Status: DC | PRN
  Administered 2022-04-10: 140 mg via INTRAVENOUS

## 2022-04-10 MED ORDER — ORAL CARE MOUTH RINSE: 15.0000 mL | Freq: Once | OROMUCOSAL | Status: DC

## 2022-04-10 MED ORDER — BUPIVACAINE-EPINEPHRINE 0.5% -1:200000 IJ SOLN
INTRAMUSCULAR | Status: AC
  Filled 2022-04-10: qty 1

## 2022-04-10 MED ORDER — SODIUM CHLORIDE (PF) 0.9 % IJ SOLN
INTRAMUSCULAR | Status: AC
Start: 2022-04-10 — End: ?
  Filled 2022-04-10: qty 30

## 2022-04-10 MED ORDER — LACTATED RINGERS IV SOLN
INTRAVENOUS | Status: DC
  Administered 2022-04-10: 1000 mL via INTRAVENOUS

## 2022-04-10 MED ORDER — OXYCODONE HCL 5 MG PO TABS: 5.0000 mg | ORAL_TABLET | Freq: Once | ORAL | Status: DC | PRN

## 2022-04-10 MED ORDER — ONDANSETRON HCL 4 MG/2ML IJ SOLN
INTRAMUSCULAR | Status: AC
  Administered 2022-04-10: 4 mg via INTRAVENOUS
  Filled 2022-04-10: qty 2

## 2022-04-10 MED ORDER — ONDANSETRON HCL 4 MG/2ML IJ SOLN: 4.0000 mg | Freq: Once | INTRAMUSCULAR | Status: AC | PRN

## 2022-04-10 MED ORDER — CELECOXIB 200 MG PO CAPS
200.0000 mg | ORAL_CAPSULE | ORAL | Status: AC
  Administered 2022-04-10: 200 mg via ORAL
  Filled 2022-04-10: qty 1

## 2022-04-10 MED ORDER — FENTANYL CITRATE (PF) 100 MCG/2ML IJ SOLN
INTRAMUSCULAR | Status: DC | PRN
  Administered 2022-04-10: 100 ug via INTRAVENOUS
  Administered 2022-04-10 (×2): 25 ug via INTRAVENOUS

## 2022-04-10 SURGICAL SUPPLY — 87 items
APL PRP STRL LF DISP 70% ISPRP (MISCELLANEOUS) ×1
APPLIER CLIP 5 13 M/L LIGAMAX5 (MISCELLANEOUS)
APPLIER CLIP ROT 10 11.4 M/L (STAPLE)
APR CLP MED LRG 11.4X10 (STAPLE)
APR CLP MED LRG 5 ANG JAW (MISCELLANEOUS)
BAG COUNTER SPONGE SURGICOUNT (BAG) IMPLANT
BAG DECANTER FOR FLEXI CONT (MISCELLANEOUS) IMPLANT
BAG SPEC RTRVL 10 TROC 200 (ENDOMECHANICALS) ×1
BAG SPNG CNTER NS LX DISP (BAG)
BLADE EXTENDED COATED 6.5IN (ELECTRODE) IMPLANT
CABLE HIGH FREQUENCY MONO STRZ (ELECTRODE) ×2 IMPLANT
CELLS DAT CNTRL 66122 CELL SVR (MISCELLANEOUS) IMPLANT
CHLORAPREP W/TINT 26 (MISCELLANEOUS) ×2 IMPLANT
CLIP APPLIE 5 13 M/L LIGAMAX5 (MISCELLANEOUS) IMPLANT
CLIP APPLIE ROT 10 11.4 M/L (STAPLE) IMPLANT
COUNTER NEEDLE 20 DBL MAG RED (NEEDLE) ×2 IMPLANT
COVER MAYO STAND STRL (DRAPES) ×6 IMPLANT
COVER SURGICAL LIGHT HANDLE (MISCELLANEOUS) ×2 IMPLANT
DEVICE TROCAR PUNCTURE CLOSURE (ENDOMECHANICALS) IMPLANT
DRAIN CHANNEL 19F RND (DRAIN) IMPLANT
DRAPE LAPAROSCOPIC ABDOMINAL (DRAPES) ×2 IMPLANT
DRAPE SHEET LG 3/4 BI-LAMINATE (DRAPES) IMPLANT
DRAPE WARM FLUID 44X44 (DRAPES) ×2 IMPLANT
DRSG OPSITE POSTOP 4X10 (GAUZE/BANDAGES/DRESSINGS) IMPLANT
DRSG OPSITE POSTOP 4X6 (GAUZE/BANDAGES/DRESSINGS) IMPLANT
DRSG OPSITE POSTOP 4X8 (GAUZE/BANDAGES/DRESSINGS) IMPLANT
DRSG TEGADERM 2-3/8X2-3/4 SM (GAUZE/BANDAGES/DRESSINGS) ×4 IMPLANT
DRSG TEGADERM 4X4.75 (GAUZE/BANDAGES/DRESSINGS) ×2 IMPLANT
ELECT REM PT RETURN 15FT ADLT (MISCELLANEOUS) ×2 IMPLANT
ENDOLOOP SUT PDS II  0 18 (SUTURE)
ENDOLOOP SUT PDS II 0 18 (SUTURE) IMPLANT
EVACUATOR SILICONE 100CC (DRAIN) IMPLANT
GAUZE 4X4 16PLY ~~LOC~~+RFID DBL (SPONGE) IMPLANT
GAUZE SPONGE 2X2 8PLY STRL LF (GAUZE/BANDAGES/DRESSINGS) ×2 IMPLANT
GAUZE SPONGE 4X4 12PLY STRL (GAUZE/BANDAGES/DRESSINGS) ×2 IMPLANT
GLOVE ECLIPSE 8.0 STRL XLNG CF (GLOVE) ×4 IMPLANT
GLOVE INDICATOR 8.0 STRL GRN (GLOVE) ×4 IMPLANT
GOWN STRL REUS W/ TWL XL LVL3 (GOWN DISPOSABLE) ×10 IMPLANT
GOWN STRL REUS W/TWL XL LVL3 (GOWN DISPOSABLE) ×5
IRRIG SUCT STRYKERFLOW 2 WTIP (MISCELLANEOUS) ×1
IRRIGATION SUCT STRKRFLW 2 WTP (MISCELLANEOUS) ×2 IMPLANT
KIT BASIN OR (CUSTOM PROCEDURE TRAY) ×2 IMPLANT
KIT TURNOVER KIT A (KITS) IMPLANT
LEGGING LITHOTOMY PAIR STRL (DRAPES) IMPLANT
PACK COLON (CUSTOM PROCEDURE TRAY) ×2 IMPLANT
PAD POSITIONING PINK XL (MISCELLANEOUS) ×2 IMPLANT
PENCIL SMOKE EVACUATOR (MISCELLANEOUS) IMPLANT
POUCH RETRIEVAL ECOSAC 10 (ENDOMECHANICALS) ×2 IMPLANT
POUCH RETRIEVAL ECOSAC 10MM (ENDOMECHANICALS) ×1
RELOAD STAPLE 60 3.6 BLU REG (STAPLE) IMPLANT
RELOAD STAPLE 60 4.1 GRN THCK (STAPLE) IMPLANT
RELOAD STAPLER BLUE 60MM (STAPLE) ×1 IMPLANT
RELOAD STAPLER GREEN 60MM (STAPLE) IMPLANT
RETRACTOR WND ALEXIS 18 MED (MISCELLANEOUS) IMPLANT
RTRCTR WOUND ALEXIS 18CM MED (MISCELLANEOUS)
SCISSORS LAP 5X35 DISP (ENDOMECHANICALS) ×2 IMPLANT
SEALER TISSUE G2 STRG ARTC 35C (ENDOMECHANICALS) ×2 IMPLANT
SET TUBE SMOKE EVAC HIGH FLOW (TUBING) ×2 IMPLANT
SLEEVE Z-THREAD 5X100MM (TROCAR) ×2 IMPLANT
SPIKE FLUID TRANSFER (MISCELLANEOUS) ×2 IMPLANT
SPONGE GAUZE 2X2 STER 10/PKG (GAUZE/BANDAGES/DRESSINGS) ×1
STAPLE ECHEON FLEX 60 POW ENDO (STAPLE) IMPLANT
STAPLER RELOAD BLUE 60MM (STAPLE) ×1
STAPLER RELOAD GREEN 60MM (STAPLE)
STAPLER VISISTAT 35W (STAPLE) ×2 IMPLANT
SUT MNCRL AB 4-0 PS2 18 (SUTURE) ×2 IMPLANT
SUT PDS AB 0 CT1 36 (SUTURE) IMPLANT
SUT PDS AB 1 CT1 27 (SUTURE) IMPLANT
SUT PDS AB 1 TP1 96 (SUTURE) IMPLANT
SUT PROLENE 0 CT 2 (SUTURE) ×2 IMPLANT
SUT PROLENE 2 0 SH DA (SUTURE) IMPLANT
SUT SILK 2 0 (SUTURE) ×1
SUT SILK 2 0 SH (SUTURE) IMPLANT
SUT SILK 2 0 SH CR/8 (SUTURE) ×2 IMPLANT
SUT SILK 2-0 18XBRD TIE 12 (SUTURE) ×2 IMPLANT
SUT SILK 3 0 (SUTURE) ×1
SUT SILK 3 0 SH CR/8 (SUTURE) ×2 IMPLANT
SUT SILK 3-0 18XBRD TIE 12 (SUTURE) ×2 IMPLANT
SUT VICRYL 0 UR6 27IN ABS (SUTURE) IMPLANT
TAPE UMBILICAL 1/8 X36 TWILL (MISCELLANEOUS) ×2 IMPLANT
TOWEL OR 17X26 10 PK STRL BLUE (TOWEL DISPOSABLE) ×2 IMPLANT
TOWEL OR NON WOVEN STRL DISP B (DISPOSABLE) IMPLANT
TRAY FOLEY MTR SLVR 14FR STAT (SET/KITS/TRAYS/PACK) ×2 IMPLANT
TRAY FOLEY MTR SLVR 16FR STAT (SET/KITS/TRAYS/PACK) IMPLANT
TRAY LAPAROSCOPIC (CUSTOM PROCEDURE TRAY) ×2 IMPLANT
TROCAR ADV FIXATION 12X100MM (TROCAR) ×2 IMPLANT
TROCAR Z-THREAD OPTICAL 5X100M (TROCAR) ×2 IMPLANT

## 2022-04-10 NOTE — Progress Notes (Signed)
Patient does not have a legal guardian

## 2022-04-10 NOTE — Discharge Instructions (Signed)
SURGERY: POST OP INSTRUCTIONS (Surgery for small bowel obstruction, colon resection, etc)   ######################################################################  EAT Gradually transition to a high fiber diet with a fiber supplement over the next few days after discharge  WALK Walk an hour a day.  Control your pain to do that.    CONTROL PAIN Control pain so that you can walk, sleep, tolerate sneezing/coughing, go up/down stairs.  HAVE A BOWEL MOVEMENT DAILY Keep your bowels regular to avoid problems.  OK to try a laxative to override constipation.  OK to use an antidairrheal to slow down diarrhea.  Call if not better after 2 tries  CALL IF YOU HAVE PROBLEMS/CONCERNS Call if you are still struggling despite following these instructions. Call if you have concerns not answered by these instructions  ######################################################################   DIET Follow a light diet the first few days at home.  Start with a bland diet such as soups, liquids, starchy foods, low fat foods, etc.  If you feel full, bloated, or constipated, stay on a ful liquid or pureed/blenderized diet for a few days until you feel better and no longer constipated. Be sure to drink plenty of fluids every day to avoid getting dehydrated (feeling dizzy, not urinating, etc.). Gradually add a fiber supplement to your diet over the next week.  Gradually get back to a regular solid diet.  Avoid fast food or heavy meals the first week as you are more likely to get nauseated. It is expected for your digestive tract to need a few months to get back to normal.  It is common for your bowel movements and stools to be irregular.  You will have occasional bloating and cramping that should eventually fade away.  Until you are eating solid food normally, off all pain medications, and back to regular activities; your bowels will not be normal. Focus on eating a low-fat, high fiber diet the rest of your life  (See Getting to Good Bowel Health, below).  CARE of your INCISION or WOUND  It is good for closed incisions and even open wounds to be washed every day.  Shower every day.  Short baths are fine.  Wash the incisions and wounds clean with soap & water.    You may leave closed incisions open to air if it is dry.   You may cover the incision with clean gauze & replace it after your daily shower for comfort.  TEGADERM:  You have clear gauze band-aid dressings over your closed incision(s).  Remove the dressings 3 days after surgery.    If you have an open wound with a wound vac, see wound vac care instructions.    ACTIVITIES as tolerated Start light daily activities --- self-care, walking, climbing stairs-- beginning the day after surgery.  Gradually increase activities as tolerated.  Control your pain to be active.  Stop when you are tired.  Ideally, walk several times a day, eventually an hour a day.   Most people are back to most day-to-day activities in a few weeks.  It takes 4-8 weeks to get back to unrestricted, intense activity. If you can walk 30 minutes without difficulty, it is safe to try more intense activity such as jogging, treadmill, bicycling, low-impact aerobics, swimming, etc. Save the most intensive and strenuous activity for last (Usually 4-8 weeks after surgery) such as sit-ups, heavy lifting, contact sports, etc.  Refrain from any intense heavy lifting or straining until you are off narcotics for pain control.  You will have off days, but   things should improve week-by-week. DO NOT PUSH THROUGH PAIN.  Let pain be your guide: If it hurts to do something, don't do it.  Pain is your body warning you to avoid that activity for another week until the pain goes down. You may drive when you are no longer taking narcotic prescription pain medication, you can comfortably wear a seatbelt, and you can safely make sudden turns/stops to protect yourself without hesitating due to pain. You may  have sexual intercourse when it is comfortable. If it hurts to do something, stop.  MEDICATIONS Take your usually prescribed home medications unless otherwise directed.   Blood thinners:  Usually you can restart any strong blood thinners after the second postoperative day.  It is OK to take aspirin right away.     If you are on strong blood thinners (warfarin/Coumadin, Plavix, Xerelto, Eliquis, Pradaxa, etc), discuss with your surgeon, medicine PCP, and/or cardiologist for instructions on when to restart the blood thinner & if blood monitoring is needed (PT/INR blood check, etc).     PAIN CONTROL Pain after surgery or related to activity is often due to strain/injury to muscle, tendon, nerves and/or incisions.  This pain is usually short-term and will improve in a few months.  To help speed the process of healing and to get back to regular activity more quickly, DO THE FOLLOWING THINGS TOGETHER: Increase activity gradually.  DO NOT PUSH THROUGH PAIN Use Ice and/or Heat Try Gentle Massage and/or Stretching Take over the counter pain medication Take Narcotic prescription pain medication for more severe pain  Good pain control = faster recovery.  It is better to take more medicine to be more active than to stay in bed all day to avoid medications.  Increase activity gradually Avoid heavy lifting at first, then increase to lifting as tolerated over the next 6 weeks. Do not "push through" the pain.  Listen to your body and avoid positions and maneuvers than reproduce the pain.  Wait a few days before trying something more intense Walking an hour a day is encouraged to help your body recover faster and more safely.  Start slowly and stop when getting sore.  If you can walk 30 minutes without stopping or pain, you can try more intense activity (running, jogging, aerobics, cycling, swimming, treadmill, sex, sports, weightlifting, etc.) Remember: If it hurts to do it, then don't do it! Use Ice and/or  Heat You will have swelling and bruising around the incisions.  This will take several weeks to resolve. Ice packs or heating pads (6-8 times a day, 30-60 minutes at a time) will help sooth soreness & bruising. Some people prefer to use ice alone, heat alone, or alternate between ice & heat.  Experiment and see what works best for you.  Consider trying ice for the first few days to help decrease swelling and bruising; then, switch to heat to help relax sore spots and speed recovery. Shower every day.  Short baths are fine.  It feels good!  Keep the incisions and wounds clean with soap & water.   Try Gentle Massage and/or Stretching Massage at the area of pain many times a day Stop if you feel pain - do not overdo it Take over the counter pain medication This helps the muscle and nerve tissues become less irritable and calm down faster Choose ONE of the following over-the-counter anti-inflammatory medications: Acetaminophen 500mg tabs (Tylenol) 1-2 pills with every meal and just before bedtime (avoid if you have liver problems or   if you have acetaminophen in you narcotic prescription) Naproxen 220mg tabs (ex. Aleve, Naprosyn) 1-2 pills twice a day (avoid if you have kidney, stomach, IBD, or bleeding problems) Ibuprofen 200mg tabs (ex. Advil, Motrin) 3-4 pills with every meal and just before bedtime (avoid if you have kidney, stomach, IBD, or bleeding problems) Take with food/snack several times a day as directed for at least 2 weeks to help keep pain / soreness down & more manageable. Take Narcotic prescription pain medication for more severe pain A prescription for strong pain control is often given to you upon discharge (for example: oxycodone/Percocet, hydrocodone/Norco/Vicodin, or tramadol/Ultram) Take your pain medication as prescribed. Be mindful that most narcotic prescriptions contain Tylenol (acetaminophen) as well - avoid taking too much Tylenol. If you are having problems/concerns with  the prescription medicine (does not control pain, nausea, vomiting, rash, itching, etc.), please call us [phone] to see if we need to switch you to a different pain medicine that will work better for you and/or control your side effects better. If you need a refill on your pain medication, you must call the office before 4 pm and on weekdays only.  By federal law, prescriptions for narcotics cannot be called into a pharmacy.  They must be filled out on paper & picked up from our office by the patient or authorized caretaker.  Prescriptions cannot be filled after 4 pm nor on weekends.    WHEN TO CALL US [phone] Severe uncontrolled or worsening pain  Fever over 101 F (38.5 C) Concerns with the incision: Worsening pain, redness, rash/hives, swelling, bleeding, or drainage Reactions / problems with new medications (itching, rash, hives, nausea, etc.) Nausea and/or vomiting Difficulty urinating Difficulty breathing Worsening fatigue, dizziness, lightheadedness, blurred vision Other concerns If you are not getting better after two weeks or are noticing you are getting worse, contact our office [phone] for further advice.  We may need to adjust your medications, re-evaluate you in the office, send you to the emergency room, or see what other things we can do to help. The clinic staff is available to answer your questions during regular business hours (8:30am-5pm).  Please don't hesitate to call and ask to speak to one of our nurses for clinical concerns.    A surgeon from [facility] is always on call at the hospitals 24 hours/day If you have a medical emergency, go to the nearest emergency room or call 911.  FOLLOW UP in our office One the day of your discharge from the hospital (or the next business weekday), please call [facility] to set up or confirm an appointment to see your surgeon in the office for a follow-up appointment.  Usually it is 2-3 weeks  after your surgery.   If you have skin staples at your incision(s), let the office know so we can set up a time in the office for the nurse to remove them (usually around 10 days after surgery). Make sure that you call for appointments the day of discharge (or the next business weekday) from the hospital to ensure a convenient appointment time. IF YOU HAVE DISABILITY OR FAMILY LEAVE FORMS, BRING THEM TO THE OFFICE FOR PROCESSING.  DO NOT GIVE THEM TO YOUR DOCTOR.  [facility] [address], [address], [place],   [address] ? [phone] - Main [phone] - Toll Free,  [phone] - Fax [email or web address]    GETTING TO GOOD BOWEL HEALTH. It is expected for your   digestive tract to need a few months to get back to normal.  It is common for your bowel movements and stools to be irregular.  You will have occasional bloating and cramping that should eventually fade away.  Until you are eating solid food normally, off all pain medications, and back to regular activities; your bowels will not be normal.   Avoiding constipation The goal: ONE SOFT BOWEL MOVEMENT A DAY!    Drink plenty of fluids.  Choose water first. TAKE A FIBER SUPPLEMENT EVERY DAY THE REST OF YOUR LIFE During your first week back home, gradually add back a fiber supplement every day Experiment which form you can tolerate.   There are many forms such as powders, tablets, wafers, gummies, etc Psyllium bran (Metamucil), methylcellulose (Citrucel), Miralax or Glycolax, Benefiber, Flax Seed.  Adjust the dose week-by-week (1/2 dose/day to 6 doses a day) until you are moving your bowels 1-2 times a day.  Cut back the dose or try a different fiber product if it is giving you problems such as diarrhea or bloating. Sometimes a laxative is needed to help jump-start bowels if constipated until the fiber supplement can help regulate your bowels.  If you are tolerating eating & you are farting, it  is okay to try a gentle laxative such as double dose MiraLax, prune juice, or Milk of Magnesia.  Avoid using laxatives too often. Stool softeners can sometimes help counteract the constipating effects of narcotic pain medicines.  It can also cause diarrhea, so avoid using for too long. If you are still constipated despite taking fiber daily, eating solids, and a few doses of laxatives, call our office. Controlling diarrhea Try drinking liquids and eating bland foods for a few days to avoid stressing your intestines further. Avoid dairy products (especially milk & ice cream) for a short time.  The intestines often can lose the ability to digest lactose when stressed. Avoid foods that cause gassiness or bloating.  Typical foods include beans and other legumes, cabbage, broccoli, and dairy foods.  Avoid greasy, spicy, fast foods.  Every person has some sensitivity to other foods, so listen to your body and avoid those foods that trigger problems for you. Probiotics (such as active yogurt, Align, etc) may help repopulate the intestines and colon with normal bacteria and calm down a sensitive digestive tract Adding a fiber supplement gradually can help thicken stools by absorbing excess fluid and retrain the intestines to act more normally.  Slowly increase the dose over a few weeks.  Too much fiber too soon can backfire and cause cramping & bloating. It is okay to try and slow down diarrhea with a few doses of antidiarrheal medicines.   Bismuth subsalicylate (ex. Kayopectate, Pepto Bismol) for a few doses can help control diarrhea.  Avoid if pregnant.   Loperamide (Imodium) can slow down diarrhea.  Start with one tablet (2mg) first.  Avoid if you are having fevers or severe pain.  ILEOSTOMY PATIENTS WILL HAVE CHRONIC DIARRHEA since their colon is not in use.    Drink plenty of liquids.  You will need to drink even more glasses of water/liquid a day to avoid getting dehydrated. Record output from your  ileostomy.  Expect to empty the bag every 3-4 hours at first.  Most people with a permanent ileostomy empty their bag 4-6 times at the least.   Use antidiarrheal medicine (especially Imodium) several times a day to avoid getting dehydrated.  Start with a dose at bedtime & breakfast.    Adjust up or down as needed.  Increase antidiarrheal medications as directed to avoid emptying the bag more than 8 times a day (every 3 hours). Work with your wound ostomy nurse to learn care for your ostomy.  See ostomy care instructions. TROUBLESHOOTING IRREGULAR BOWELS 1) Start with a soft & bland diet. No spicy, greasy, or fried foods.  2) Avoid gluten/wheat or dairy products from diet to see if symptoms improve. 3) Miralax 17gm or flax seed mixed in 8oz. water or juice-daily. May use 2-4 times a day as needed. 4) Gas-X, Phazyme, etc. as needed for gas & bloating.  5) Prilosec (omeprazole) over-the-counter as needed 6)  Consider probiotics (Align, Activa, etc) to help calm the bowels down  Call your doctor if you are getting worse or not getting better.  Sometimes further testing (cultures, endoscopy, X-ray studies, CT scans, bloodwork, etc.) may be needed to help diagnose and treat the cause of the diarrhea. [facility] [address], [address], [place], [place]  [address] [phone] - Main.    [phone]  - Toll Free.   [phone] - Fax [email or web address]  

## 2022-04-10 NOTE — Anesthesia Preprocedure Evaluation (Signed)
Anesthesia Evaluation  Patient identified by MRN, date of birth, ID band Patient awake    Reviewed: Allergy & Precautions, NPO status , Patient's Chart, lab work & pertinent test results  Airway Mallampati: II  TM Distance: >3 FB Neck ROM: Full    Dental no notable dental hx.    Pulmonary neg pulmonary ROS,    Pulmonary exam normal breath sounds clear to auscultation       Cardiovascular negative cardio ROS Normal cardiovascular exam Rhythm:Regular Rate:Normal     Neuro/Psych negative neurological ROS  negative psych ROS   GI/Hepatic negative GI ROS, Neg liver ROS,   Endo/Other  negative endocrine ROS  Renal/GU negative Renal ROS  negative genitourinary   Musculoskeletal negative musculoskeletal ROS (+)   Abdominal   Peds negative pediatric ROS (+)  Hematology negative hematology ROS (+)   Anesthesia Other Findings   Reproductive/Obstetrics negative OB ROS                             Anesthesia Physical Anesthesia Plan  ASA: 2  Anesthesia Plan: General   Post-op Pain Management: Lidocaine infusion*   Induction: Intravenous  PONV Risk Score and Plan: 3 and Ondansetron, Dexamethasone and Treatment may vary due to age or medical condition  Airway Management Planned: Oral ETT  Additional Equipment:   Intra-op Plan:   Post-operative Plan: Extubation in OR  Informed Consent: I have reviewed the patients History and Physical, chart, labs and discussed the procedure including the risks, benefits and alternatives for the proposed anesthesia with the patient or authorized representative who has indicated his/her understanding and acceptance.     Dental advisory given  Plan Discussed with: CRNA and Surgeon  Anesthesia Plan Comments:         Anesthesia Quick Evaluation

## 2022-04-10 NOTE — Interval H&P Note (Signed)
History and Physical Interval Note:  04/10/2022 10:38 AM  Alexis Holmes  has presented today for surgery, with the diagnosis of PELVIS ABSCESS.  The various methods of treatment have been discussed with the patient and family. After consideration of risks, benefits and other options for treatment, the patient has consented to  Procedure(s): APPENDECTOMY LAPAROSCOPIC, PARTIAL CECECTOMY (N/A) POSSIBLE PARTIAL COLECTOMY (N/A) as a surgical intervention.  The patient's history has been reviewed, patient examined, no change in status, stable for surgery.  I have reviewed the patient's chart and labs.  Questions were answered to the patient's satisfaction.    I have re-reviewed the the patient's records, history, medications, and allergies.  I have re-examined the patient.  I again discussed intraoperative plans and goals of post-operative recovery.  The patient agrees to proceed.  Alexis Holmes  [birth year] [id number]  Patient Care Team: Corrington, Delsa Grana, MD as PCP - General (Family Medicine) Richmond Campbell, MD as Consulting Physician (Gastroenterology)  Patient Active Problem List   Diagnosis Date Noted   Abnormal CT of the abdomen 02/26/2022   Ileitis 02/26/2022   History of colonic polyps 01/11/2022   Appendicitis 01/11/2022   Nausea & vomiting 01/11/2022   Constipation 01/11/2022   Other specified disorders of bone density and structure, unspecified site 01/11/2022   Pure hypercholesterolemia 01/11/2022   Sepsis due to undetermined organism ([place]) 01/09/2022   Ileitis, 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[place]) 01/09/2022   BRONCHITIS, ACUTE 04/15/2011   Menopause 03/12/2011   Arthritis 03/12/2011   Other and unspecified hyperlipidemia 03/12/2011   Tremor 03/12/2011   Vitamin D deficiency 03/12/2011   Osteopenia 03/12/2011   Colon polyps 03/12/2011    Past Medical History:  Diagnosis Date   Anemia    Anxiety    Arthritis    Colon polyps    Depression    History of kidney stones     Hypercholesterolemia    Intention tremor    Right   Menopause    Osteopenia    Vitamin D deficiency     Past Surgical History:  Procedure Laterality Date   COLONOSCOPY     EXTRACORPOREAL SHOCK WAVE LITHOTRIPSY Left 02/20/2020   Procedure: EXTRACORPOREAL SHOCK WAVE LITHOTRIPSY (ESWL);  Surgeon: Raynelle Bring, MD;  Location: [facility];  Service: Urology;  Laterality: Left;   IR RADIOLOGIST EVAL & MGMT  01/30/2022   LEG SURGERY  1988-1990   s/p fracture    Social History   Socioeconomic History   Marital status: Divorced    Spouse name: Not on file   Number of children: 0   Years of education: Assoc deg   Highest education level: Not on file  Occupational History   Occupation: [employer]    Employer: Office manager PA   Occupation: retired  Tobacco Use   Smoking status: Never   Smokeless tobacco: Never  Scientific laboratory technician Use: Never used  Substance and Sexual Activity   Alcohol use: No   Drug use: No   Sexual activity: Yes    Partners: Female  Other Topics Concern   Not on file  Social History Narrative   Not on file   Social Determinants of Health   Financial Resource Strain: Not on file  Food Insecurity: Not on file  Transportation Needs: Not on file  Physical Activity: Not on file  Stress: Not on file  Social Connections: Not on file  Intimate Partner Violence: Not on file    Family History  Problem Relation Age of Onset  Fibromyalgia Brother    Diabetes Maternal Grandfather    Breast cancer Maternal Aunt     Medications Prior to Admission  Medication Sig Dispense Refill Last Dose   alendronate (FOSAMAX) 70 MG tablet Take 70 mg by mouth once a week.   04/07/2022   Cholecalciferol (VITAMIN D) 125 MCG (5000 UT) CAPS Take 5,000 Units by mouth daily.   04/04/2022   DULoxetine (CYMBALTA) 20 MG capsule Take 20 mg by mouth daily.   04/04/2022   Omega-3 Fatty Acids (FISH OIL) 1000 MG CAPS Take 1,000 mg by mouth daily.   04/04/2022    SELENIUM PO Take 1 tablet by mouth daily.   04/04/2022   zinc gluconate 50 MG tablet Take 50 mg by mouth daily.   04/04/2022   oxyCODONE (OXY IR/ROXICODONE) 5 MG immediate release tablet Take 1 tablet (5 mg total) by mouth every 6 (six) hours as needed for severe pain. (Patient not taking: Reported on 03/20/2022) 15 tablet 0 Not Taking   Sodium Chloride Flush (NORMAL SALINE FLUSH) 0.9 % SOLN Flush pelvic drain with 5 mls sterile saline once daily as directed (Patient not taking: Reported on 03/20/2022) 300 mL 3 Not Taking    Current Facility-Administered Medications  Medication Dose Route Frequency Provider Last Rate Last Admin   bupivacaine liposome (EXPAREL) 1.3 % injection 266 mg  20 mL Infiltration Once Michael Boston, MD       cefoTEtan (CEFOTAN) 2 g in sodium chloride 0.9 % 100 mL IVPB  2 g Intravenous On Call to OR Michael Boston, MD       chlorhexidine (PERIDEX) 0.12 % solution 15 mL  15 mL Mouth/Throat Once Myrtie Soman, MD       Or   Oral care mouth rinse  15 mL Mouth Rinse Once Myrtie Soman, MD       feeding supplement (ENSURE PRE-SURGERY) liquid 296 mL  296 mL Oral Once Michael Boston, MD       feeding supplement (ENSURE PRE-SURGERY) liquid 592 mL  592 mL Oral Once Michael Boston, MD       lactated ringers infusion   Intravenous Continuous Myrtie Soman, MD 10 mL/hr at 04/10/22 1030 Continued from Pre-op at 04/10/22 1030     No Known Allergies  BP 127/73 (BP Location: Right Arm)   Pulse 67   Temp 99.2 F (37.3 C) (Oral)   Resp 13   Ht '5\' 1"'$  (1.549 m)   Wt 58.1 kg   SpO2 100%   BMI 24.20 kg/m   Labs: Results for orders placed or performed during the hospital encounter of 04/10/22 (from the past 48 hour(s))  ABO/Rh     Status: None   Collection Time: 04/10/22  9:53 AM  Result Value Ref Range   ABO/RH(D)      A POS Performed at [facility], [place] [address] [place], [place] [address]     Imaging / Studies: No results found.   Adin Hector, M.D.,  F.A.C.S. Gastrointestinal and Minimally Invasive Surgery [facility] [address] [place], [place] [address] [phone] Main / Paging  04/10/2022 10:38 AM    Adin Hector

## 2022-04-10 NOTE — Telephone Encounter (Signed)
  Follow up Call-     04/09/2022    8:23 AM  Call back number  Post procedure Call Back phone  # [phone]  Permission to leave phone message Yes     Patient questions:  Do you have a fever, pain , or abdominal swelling? No. Pain Score  0 *  Have you tolerated food without any problems? Yes.    Have you been able to return to your normal activities? Yes.    Do you have any questions about your discharge instructions: Diet   No. Medications  No. Follow up visit  No.  Do you have questions or concerns about your Care? No.  Actions: * If pain score is 4 or above: No action needed, pain <4.

## 2022-04-10 NOTE — Anesthesia Postprocedure Evaluation (Signed)
Anesthesia Post Note  Patient: Alexis Holmes  Procedure(s) Performed: APPENDECTOMY LAPAROSCOPIC, BILATERAL TAP BLOCK, LYSIS OF ADHESIONS     Patient location during evaluation: PACU Anesthesia Type: General Level of consciousness: awake and alert Pain management: pain level controlled Vital Signs Assessment: post-procedure vital signs reviewed and stable Respiratory status: spontaneous breathing, nonlabored ventilation, respiratory function stable and patient connected to nasal cannula oxygen Cardiovascular status: blood pressure returned to baseline and stable Postop Assessment: no apparent nausea or vomiting Anesthetic complications: no   No notable events documented.  Last Vitals:  Vitals:   04/10/22 1400 04/10/22 1500  BP: (!) 111/57 105/62  Pulse: 71 69  Resp:    Temp:    SpO2: 97% 96%    Last Pain:  Vitals:   04/10/22 1530  TempSrc:   PainSc: 0-No pain                 Franco Duley S

## 2022-04-10 NOTE — Anesthesia Procedure Notes (Signed)
Procedure Name: Intubation Date/Time: 04/10/2022 11:21 AM  Performed by: Lavina Hamman, CRNAPre-anesthesia Checklist: Patient identified, Emergency Drugs available, Suction available, Patient being monitored and Timeout performed Patient Re-evaluated:Patient Re-evaluated prior to induction Oxygen Delivery Method: Circle system utilized Preoxygenation: Pre-oxygenation with 100% oxygen Induction Type: IV induction Ventilation: Mask ventilation without difficulty Laryngoscope Size: Mac and 4 Grade View: Grade I Tube type: Oral Tube size: 7.0 mm Number of attempts: 1 Airway Equipment and Method: Stylet Placement Confirmation: ETT inserted through vocal cords under direct vision, positive ETCO2, CO2 detector and breath sounds checked- equal and bilateral Secured at: 21 cm Tube secured with: Tape Dental Injury: Teeth and Oropharynx as per pre-operative assessment  Comments: ATOI

## 2022-04-10 NOTE — Op Note (Signed)
PATIENT:  Alexis Holmes  71 y.o. female  Patient Care Team: Corrington, Delsa Grana, MD as PCP - General (Family Medicine) Richmond Campbell, MD as Consulting Physician (Gastroenterology) Pyrtle, Lajuan Lines, MD as Consulting Physician (Gastroenterology) Michael Boston, MD as Consulting Physician (General Surgery)  PRE-OPERATIVE DIAGNOSIS:  PELVIC ABSCESS from probable prior perforated appendicitis.  POST-OPERATIVE DIAGNOSIS: Perforated appendicitis status post pelvic drainage.  PROCEDURE:   LAPAROSCOPIC APPENDECTOMY TRANSVERSUS ABDOMINIS PLANE (TAP) BLOCK - BILATERAL LAPAROSCOPIC LYSIS OF ADHESIONS  SURGEON:  Adin Hector, MD  ASSIST:  Charlett Nose, MD, [employer]   ANESTHESIA:   local and general  EBL:  Total I/O In: 700 [I.V.:700] Out: 98 [Blood:50]  Delay start of Pharmacological VTE agent (>24hrs) due to surgical blood loss or risk of bleeding:  no  DRAINS: none   SPECIMEN:   APPENDIX  DISPOSITION OF SPECIMEN:  PATHOLOGY  COUNTS:  YES  PLAN OF CARE: Discharge to home after PACU  PATIENT DISPOSITION:  PACU - hemodynamically stable.   INDICATIONS: Patient with concerning symptoms & work up with large pelvic abscess.  Underwent admission IV antibiotics and drainage and gradually improved.  Rest of the differential diagnosis seems unlikely and appendicitis was favored.  Patient underwent colonoscopy yesterday that disproved any rectosigmoid pathology nor any inflammatory bowel disease.  Suspicious for appendicitis.  Surgery was recommended:  The anatomy & physiology of the digestive tract was discussed.  The pathophysiology of appendicitis was discussed.  Natural history risks without surgery was discussed.   I feel the risks of no intervention will lead to serious problems that outweigh the operative risks; therefore, I recommended diagnostic laparoscopy with removal of appendix to remove the pathology.  Laparoscopic & open techniques were discussed.   I noted a good  likelihood this will help address the problem.    Risks such as bleeding, infection, abscess, leak, reoperation, possible ostomy, hernia, heart attack, death, and other risks were discussed.  Goals of post-operative recovery were discussed as well.  We will work to minimize complications.  Questions were answered.  The patient expresses understanding & wishes to proceed with surgery.  OR FINDINGS: Moderately dense adhesions of small bowel to pelvis.  Inflamed phlegmon appendix consistent with chronic appendicitis.  No evidence of any Crohn's ileitis, Meckel's diverticulum, colitis.  CASE DATA:  Type of patient?: Elective [facility] Private Case  Status of Case? Elective Scheduled  Infection Present At Time Of Surgery (PATOS)?  PHLEGMON  DESCRIPTION:   The patient was identified & brought into the operating room. The patient was positioned supine with arms tucked. SCDs were active during the entire case. The patient underwent general anesthesia without any difficulty.  The abdomen was prepped and draped in a sterile fashion. A Surgical Timeout confirmed our plan.  We made a transverse incision through the superior umbilical fold.  I made a small transverse nick through the infraumbilical fascia and confirmed peritoneal entry.  I placed a 89m port.  We induced carbon dioxide insufflation.  Camera inspection revealed no injury.  I placed additional ports under direct laparoscopic visualization.  Upon entering the abdomen (organ space), I encountered dense adhesions with a thickened appendix consistent with chronic phlegmonous appendicitis.  I mobilized the terminal ileum to proximal ascending colon in a lateral to medial fashion.  Patient had dense ileal adhesions down the pelvis.  We gradually freed off interloop adhesions as well as adhesions to the adnexa and rectosigmoid colon.  Eventually we got all loops of small bowel out of the  pelvis.  This better helped [place] to see the ileocecal region and identify  a thickened appendix adherent to the right pelvic sidewall and gutter.  We took care to avoid injuring any retroperitoneal structures.  We freed the appendix off its attachments to the ascending colon and cecal mesentery.  I elevated the appendix. I skeletonized the mesoappendix. I was able to free off the base of the appendix which was still viable.  I stapled the appendix off the cecum using a laparoscopic stapler. I took a healthy cuff of viable cecum. I ligated the mesoappendix and assured hemostasis in the mesentery.  We placed the appendix inside an EcoSac bag and removed out the 76m stapler port.  I did copious irrigation. Hemostasis was good in the mesoappendix, colon mesentery, and retroperitoneum. Staple line was intact on the cecum with no bleeding. I washed out the pelvis, retrohepatic space and right paracolic gutter. I washed out the left side as well.  Hemostasis is good. There was no perforation or injury.  Because the area cleaned up well after irrigation, I did not place a drain.  I closed the 12 mm stapler port site fascia with 0 Vicryl stitch using a suture passer under direct laparoscopic visualization.   I aspirated the carbon dioxide. Ports removed.  I closed skin using 4-0 monocryl stitch.  Sterile dressings applied.  Patient was extubated and sent to the recovery room.  . I discussed operative findings, updated the patient's status, discussed probable steps to recovery, and gave postoperative recommendations to the patient's friend, SJudson Roch.  Recommendations were made.  Questions were answered.  She expressed understanding & appreciation.  Questions answered. They expressed understanding and appreciation.  SAdin Hector M.D., F.A.C.S. Gastrointestinal and Minimally Invasive Surgery [facility] [address] [place] [phone] / Paging  04/10/2022 12:42 PM

## 2022-04-10 NOTE — Transfer of Care (Signed)
Immediate Anesthesia Transfer of Care Note  Patient: Alexis Holmes  Procedure(s) Performed: Procedure(s): APPENDECTOMY LAPAROSCOPIC, BILATERAL TAP BLOCK, LYSIS OF ADHESIONS (N/A)  Patient Location: PACU  Anesthesia Type:General  Level of Consciousness:  sedated, patient cooperative and responds to stimulation  Airway & Oxygen Therapy:Patient Spontanous Breathing and Patient connected to face mask oxgen  Post-op Assessment:  Report given to PACU RN and Post -op Vital signs reviewed and stable  Post vital signs:  Reviewed and stable  Last Vitals:  Vitals:   04/10/22 0903  BP: 127/73  Pulse: 67  Resp: 13  Temp: 37.3 C  SpO2: 161%    Complications: No apparent anesthesia complications

## 2022-04-10 NOTE — H&P (Signed)
04/10/2022   REFERRING PHYSICIAN: None  Patient Care Team: None as PCP - General Garon Melander, Adrian Saran, MD as Consulting Provider (General Surgery) Zehr, Laban Emperor, PA as Physician Assistant (Gastroenterology) Beryle Beams, MD (Gastroenterology) Fnp, Thedora Hinders as Nurse Practitioner (Internal Medicine)  PROVIDER: Hollace Kinnier, MD  [facility] MRN: [id number] DOB: [birth year]  SUBJECTIVE   Chief Complaint: new patient   History of Present Illness: Alexis Holmes is a 71 y.o. female who is seen today  as an office consultation .   Pleasant woman that came in with abdominal pain and CAT scan found to have an abscess. Was admitted with IV antibiotics. She initially stabilized and worsening. After some discussion and pushed for a drain to be placed in the pelvis. Had some green drainage suspicious for leak in the digestive track into this abscess cavity. She felt much better. She stabilized and went home on oral antibiotics. Diagnosis concerning for ileitis versus appendicitis. Since she was discharged she had a follow-up drain study that showed no fistula and resolution of abscess so the drain was removed. She comes in for follow-up.  Patient had a colonoscopy Dr. Earlean Shawl many years ago. He has since retired. Believes she had reestablished with Dr. Carol Ada. We talked about trying to get interval colonoscopy to make sure this was not Crohn's disease versus just complicated appendicitis. Dr. Benson Norway noted that he was not available for numerous months and recommended reaching out to different group. He covers call with [facility] so we chose that group. there was some confusion on this but it looks like she is due to see one of their physician assistants, Alonza Bogus, next week.  Patient comes in with her friend. She is feeling much better overall. The drain is out. She has been off antibiotics for the past couple of weeks. Appetite is back to normal. Her bowels are somewhat  irregular but nothing too severe. No fevers or chills. This is the best she has felt in months.  Medical History:  No past medical history on file.  Patient Active Problem List  Diagnosis  Pelvic abscess in female  Acute appendicitis with perforation and peritoneal abscess  Diverticulosis of sigmoid colon   Past Surgical History:  Procedure Laterality Date  broken leg 1989    No Known Allergies  Current Outpatient Medications on File Prior to Visit  Medication Sig Dispense Refill  DULoxetine (CYMBALTA) 20 MG DR capsule Take 20 mg by mouth once daily   No current facility-administered medications on file prior to visit.   No family history on file.   Social History   Tobacco Use  Smoking Status Never  Smokeless Tobacco Never    Social History   Socioeconomic History  Marital status: Single  Tobacco Use  Smoking status: Never  Smokeless tobacco: Never  Vaping Use  Vaping Use: Never used  Substance and Sexual Activity  Alcohol use: Never  Drug use: Never   ############################################################  Review of Systems: A complete review of systems (ROS) was obtained from the patient. I have reviewed this information and discussed as appropriate with the patient. See HPI as well for other pertinent ROS.  Constitutional: No fevers, chills, sweats. Weight stable Eyes: No vision changes, No discharge HENT: No sore throats, nasal drainage Lymph: No neck swelling, No bruising easily Pulmonary: No cough, productive sputum CV: No orthopnea, PND . No exertional chest/neck/shoulder/arm pain. Patient can walk 30 minutes without difficulty.   GI: No personal nor family history of GI/colon cancer,  inflammatory bowel disease, irritable bowel syndrome, allergy such as Celiac Sprue, dietary/dairy problems, colitis, ulcers nor gastritis. No recent sick contacts/gastroenteritis. No travel outside the country. No changes in diet.  Renal: No UTIs, No  hematuria Genital: No drainage, bleeding, masses Musculoskeletal: No severe joint pain. Good ROM major joints Skin: No sores or lesions Heme/Lymph: No easy bleeding. No swollen lymph nodes Neuro: No active seizures. No facial droop Psych: No hallucinations. No agitation  OBJECTIVE   Vitals:  02/10/22 0902  BP: 114/72  Pulse: 87  Temp: 36.8 C (98.2 F)  SpO2: 99%  Weight: 57.4 kg (126 lb 9.6 oz)  Height: 154.9 cm ('5\' 1"'$ )   Body mass index is 23.92 kg/m.  PHYSICAL EXAM:  Constitutional: Not cachectic. Hygeine adequate. Vitals signs as above.  Eyes: No glasses. Vision adequate,Pupils reactive, normal extraocular movements. Sclera nonicteric Neuro: CN II-XII intact. No major focal sensory defects. No major motor deficits. Lymph: No head/neck/groin lymphadenopathy Psych: No severe agitation. No severe anxiety. Judgment & insight Adequate, Oriented x4, HENT: Normocephalic, Mucus membranes moist. No thrush. Hearing: adequate Neck: Supple, No tracheal deviation. No obvious thyromegaly Chest: No pain to chest wall compression. Good respiratory excursion. No audible wheezing CV: Pulses intact. regular. No major extremity edema Ext: No obvious deformity or contracture. Edema: Not present. No cyanosis Skin: No major subcutaneous nodules. Warm and dry Musculoskeletal: Severe joint rigidity not present. No obvious clubbing. No digital petechiae. Mobility: no assist device moving easily without restrictions  Abdomen: Flat Soft. Nondistended. Nontender. Hernia: Not present. Diastasis recti: Not present. No hepatomegaly. No splenomegaly.  Genital/Pelvic: Inguinal hernia: Not present. Inguinal lymph nodes: without lymphadenopathy nor hidradenitis.   Rectal: (Deferred)    ###################################################################  Labs, Imaging and Diagnostic Testing:  Located in 'Care Everywhere' section of Epic EMR chart  PRIOR [facility] NOTES:  Located in [place]' section of Epic EMR chart  SURGERY NOTES:  Not applicable  PATHOLOGY:  Not applicable  Assessment and Plan:  DIAGNOSES:  Diagnoses and all orders for this visit:  Pelvic abscess in female  Acute appendicitis with perforation and peritoneal abscess  Diverticulosis of sigmoid colon  Other orders - metroNIDAZOLE (FLAGYL) 500 MG tablet; Take 2 tablets (1,000 mg total) by mouth 3 (three) times daily for 3 doses SEE BOWEL PREP INSTRUCTIONS: Take 2 tablets at 2pm, 3pm, and 10pm the day prior to your colon operation. - neomycin 500 mg tablet; Take 2 tablets (1,000 mg total) by mouth 3 (three) times daily for 3 doses SEE BOWEL PREP INSTRUCTIONS: Take 2 tablets at 2pm, 3pm, and 10pm the day prior to your colon operation.    ASSESSMENT/PLAN  Recovering status post admission with IV antibiotics and drainage of right lower pelvic abscess drain transgluteal. Differential diagnosis favors appendicitis over ileitis. The fact that inflammation has gone down without immunosuppression and she has no chronic fistula argues against Crohn's disease but we will see. She does have evidence of diverticular disease but no evidence of diverticulitis.  Colonoscopy yesterday showing no major diverticular stricture or tumor.  No obvious evidence of inflammatory bowel disease.  Therefore proceed with interval appendectomy.  Possible ileocecectomy.  The anatomy & physiology of the digestive tract was discussed. The pathophysiology of appendicitis and other appendiceal disorders were discussed. Natural history risks without surgery was discussed. I feel the risks of no intervention will lead to serious problems that outweigh the operative risks; therefore, I recommended diagnostic laparoscopy with removal of appendix to remove the pathology. Laparoscopic & open techniques were  discussed. I noted a good likelihood this will help address the problem.   Risks such as bleeding, infection, abscess, leak,  reoperation, injury to other organs, need for repair of tissues / organs, possible ostomy, hernia, heart attack, stroke, death, and other risks were discussed. Goals of post-operative recovery were discussed as well. We will work to minimize complications. Questions were answered. The patient expresses understanding & wishes to proceed with surgery.  She has some evidence of diverticular disease by CAT scan but no diverticulitis I do not think this is much of an etiology.

## 2022-04-11 ENCOUNTER — Encounter (HOSPITAL_COMMUNITY): Payer: Self-pay | Admitting: Surgery

## 2022-04-11 ENCOUNTER — Encounter: Payer: Self-pay | Admitting: Internal Medicine

## 2022-04-11 LAB — SURGICAL PATHOLOGY

## 2022-08-07 IMAGING — CT CT IMAGE GUIDED DRAINAGE BY PERCUTANEOUS CATHETER
1 of 8 series · 10 of 32 positions shown, 16 images · non-contrast
Comparison: none

INDICATION: Patient has a small 4 x 2.3 cm loculated abscess at the right lower
pelvis requiring drain insertion

EXAM:
CT-guided 10 French drainage catheter insertion into the right lower
pelvic absence.
TECHNIQUE: Multidetector CT imaging of the was performed following the standard
protocol with/without IV contrast.

[Series 2: i-spiral 5.0 bf37 · axial · 0.98mm/px · z∈[-368,-263]mm · 10 of 38 slices shown, 16 images]
[im 4/38  soft-tissue]
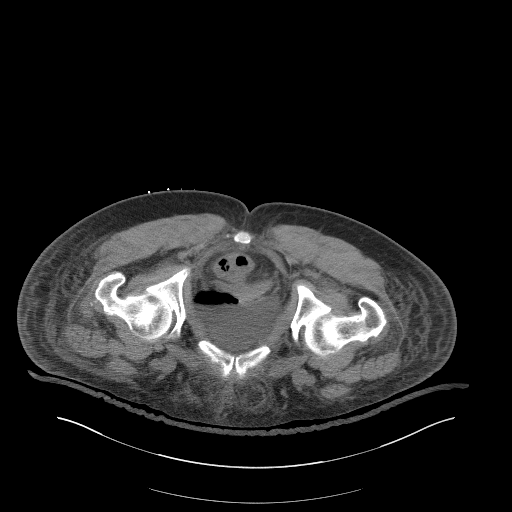
[im 4/38  bone]
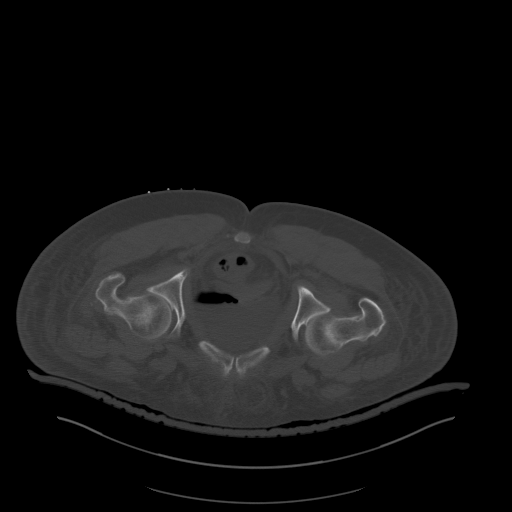
[im 7/38  soft-tissue]
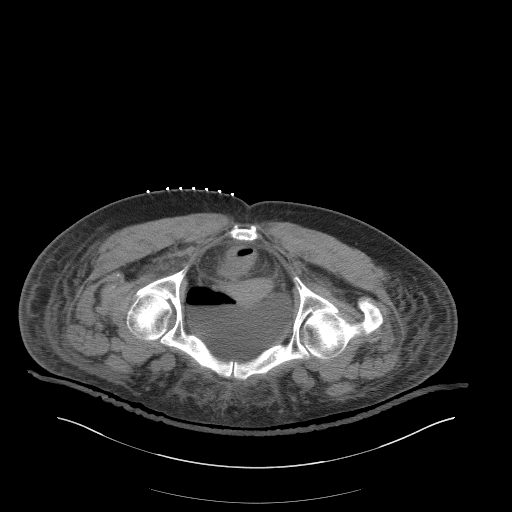
[im 11/38  soft-tissue]
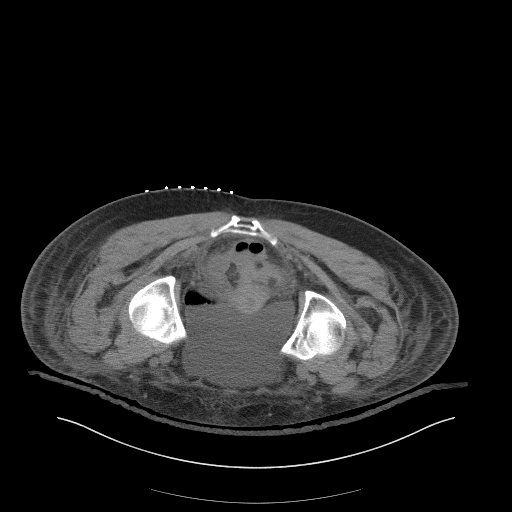
[im 14/38  soft-tissue]
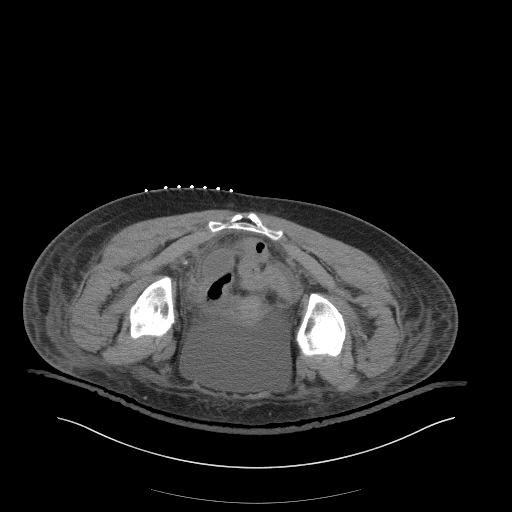
[im 17/38  soft-tissue]
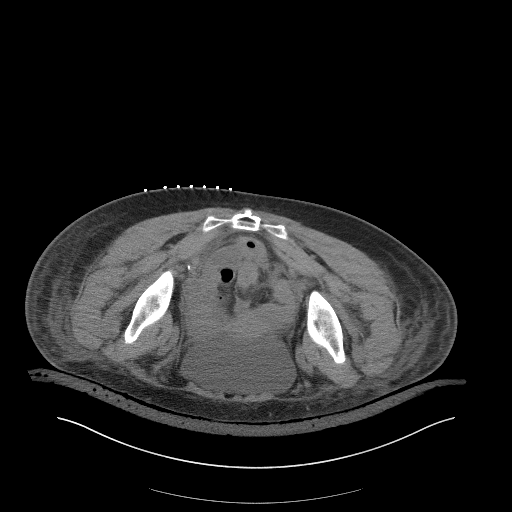
[im 21/38  soft-tissue]
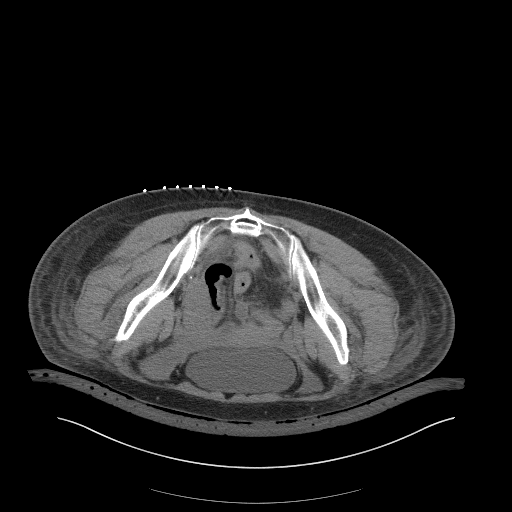
[im 24/38  soft-tissue]
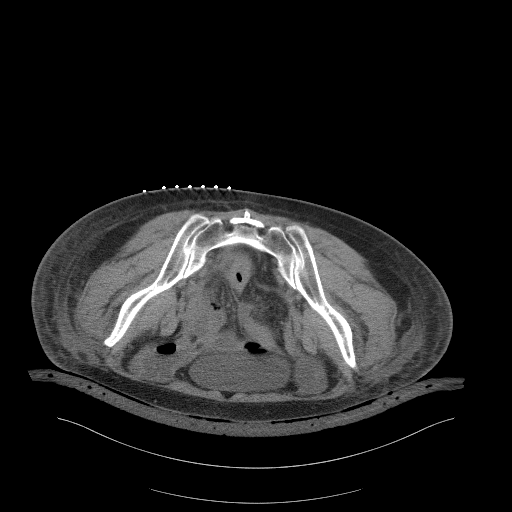
[im 24/38  lung]
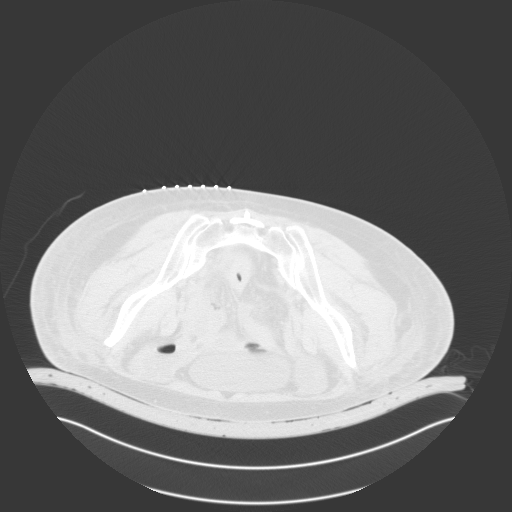
[im 27/38  soft-tissue]
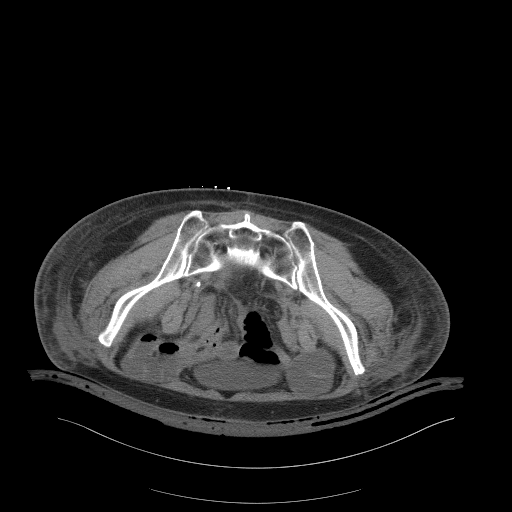
[im 27/38  lung]
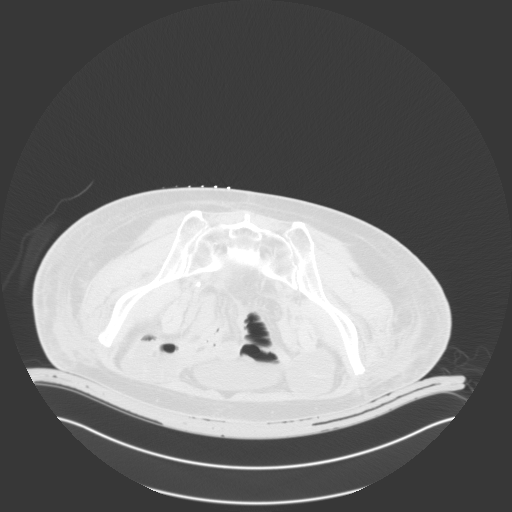
[im 31/38  soft-tissue]
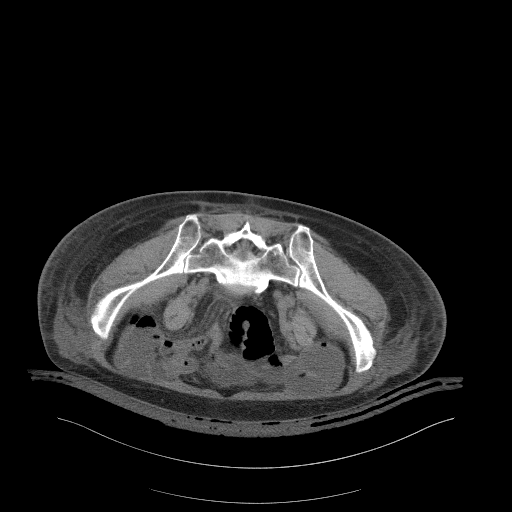
[im 31/38  lung]
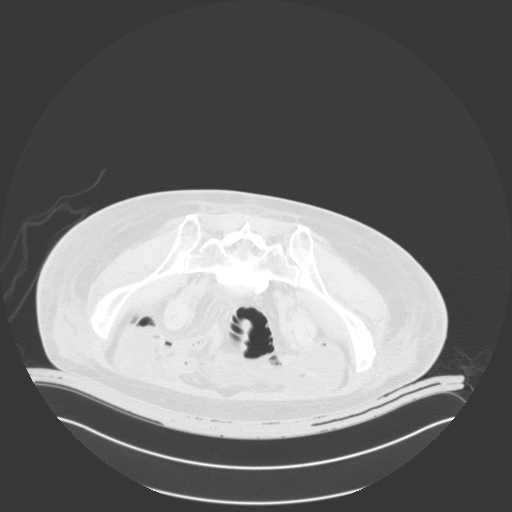
[im 31/38  bone]
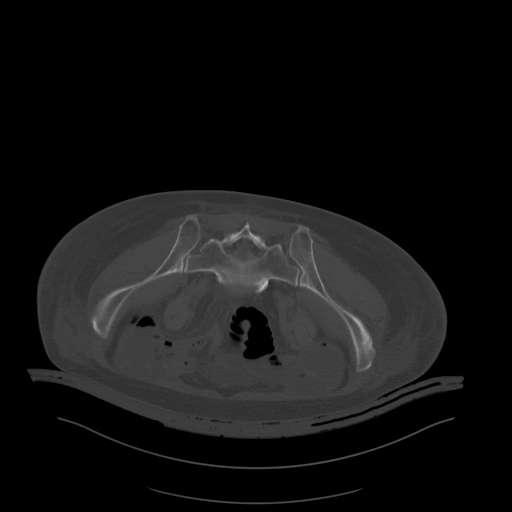
[im 34/38  soft-tissue]
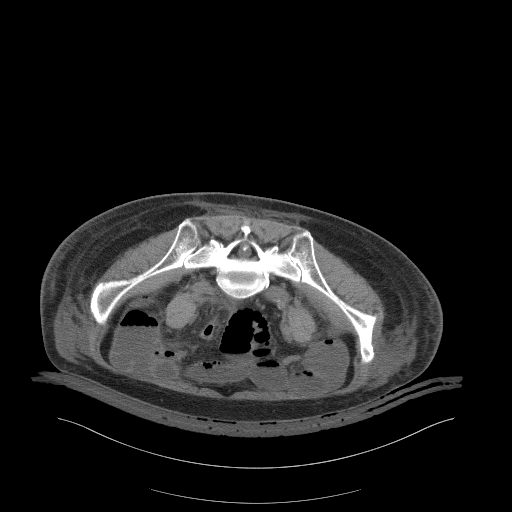
[im 34/38  lung]
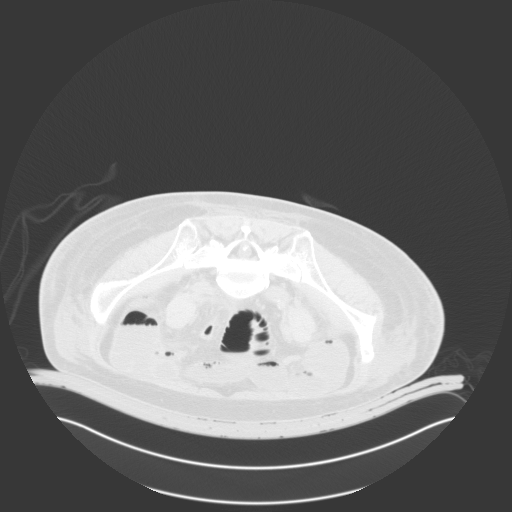

[10 of 32 positions shown; findings below may reference images not displayed]

RADIATION DOSE REDUCTION: This exam was performed according to the
departmental dose-optimization program which includes automated
exposure control, adjustment of the mA and/or kV according to
patient size and/or use of iterative reconstruction technique.

MEDICATIONS:
The patient is currently admitted to the hospital and receiving
intravenous antibiotics. The antibiotics were administered within an
appropriate time frame prior to the initiation of the procedure.

ANESTHESIA/SEDATION:
Moderate (conscious) sedation was employed during this procedure. A
total of Versed 2.0 mg and Fentanyl 100 mcg was administered
intravenously by the radiology nurse.

Total intra-service moderate Sedation Time: 30 minutes. The
patient's level of consciousness and vital signs were monitored
continuously by radiology nursing throughout the procedure under my
direct supervision.

COMPLICATIONS:
None immediate.

PROCEDURE:
Informed written consent was obtained from the patient after a
thorough discussion of the procedural risks, benefits and
alternatives. All questions were addressed. Maximal Sterile Barrier
Technique was utilized including caps, mask, sterile gowns, sterile
gloves, sterile drape, hand hygiene and skin antiseptic. A timeout
was performed prior to the initiation of the procedure.

Patient was placed prone on the CT table and selected CT imaging of
the abscess area was performed. Skin corresponding to the abscess
pocket was marked, prepped, draped and anesthetized with 1%
xylocaine with the needle to be inserted by the right posterolateral
transgluteal approach. Under CT guidance, 18 gauge trocar needle was
inserted into the abscess pocket. The inner stylet was removed, 035
inch Amplatz wire was introduced, access site was dilated and a 10
French drainage catheter was inserted to form the Willemke loop within
the abscess pocket. Approximately 10 mL of thin foul-smelling pus
was drained. Catheter was sutured to the skin using 2-0 silk and was
connected to a JP bulb. Sterile dressing was placed. The specimen
was sent to the lab for culture and sensitivity. Patient tolerated
the procedure well.
IMPRESSION: CT-guided drain insertion into the right lower pelvic abscess.

## 2022-08-24 IMAGING — CT CT ABD-PELV W/ CM
2 of 4 series · 12 of 46 positions shown, 14 images · IV contrast (agent unspecified)
Comparison: 01/12/2022

CLINICAL DATA: 70-year-old female with a history of transgluteal
drain

EXAM:
CT ABDOMEN AND PELVIS WITH CONTRAST
TECHNIQUE: Multidetector CT imaging of the abdomen and pelvis was performed
using the standard protocol following bolus administration of
intravenous contrast.

[Series 2: abd pelvis 5.00 br40 s3 axial · axial · 0.54mm/px · z∈[+1149,+1499]mm · 9 of 84 slices shown, 11 images]
[im 7/84  soft-tissue]
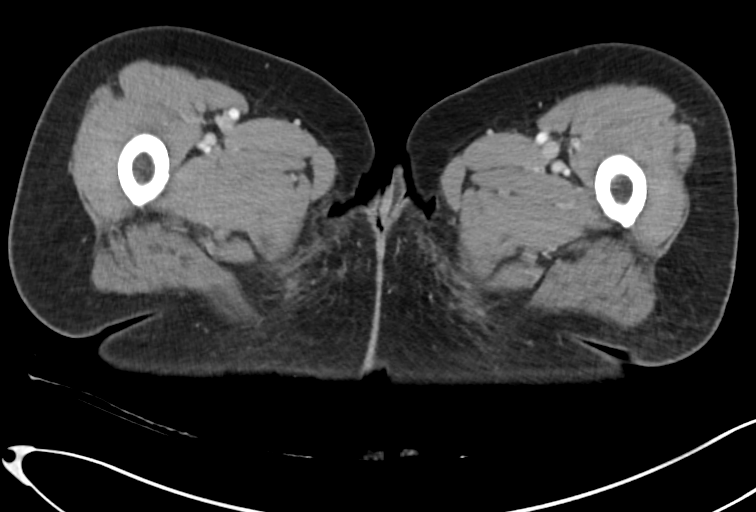
[im 7/84  bone]
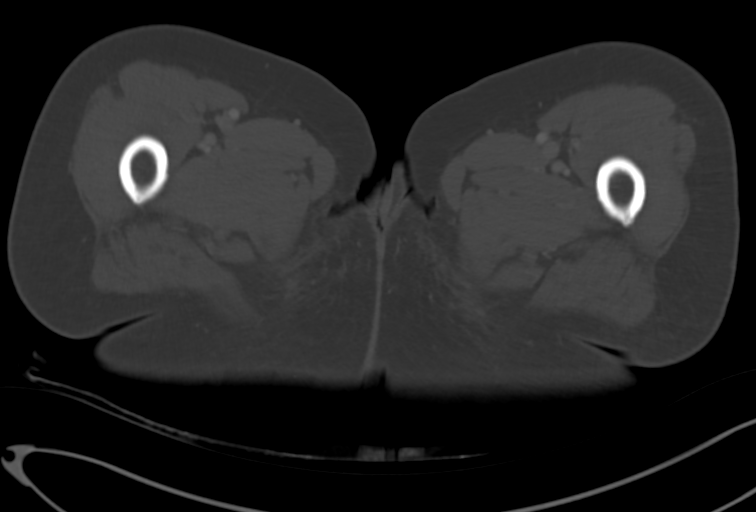
[im 14/84  soft-tissue]
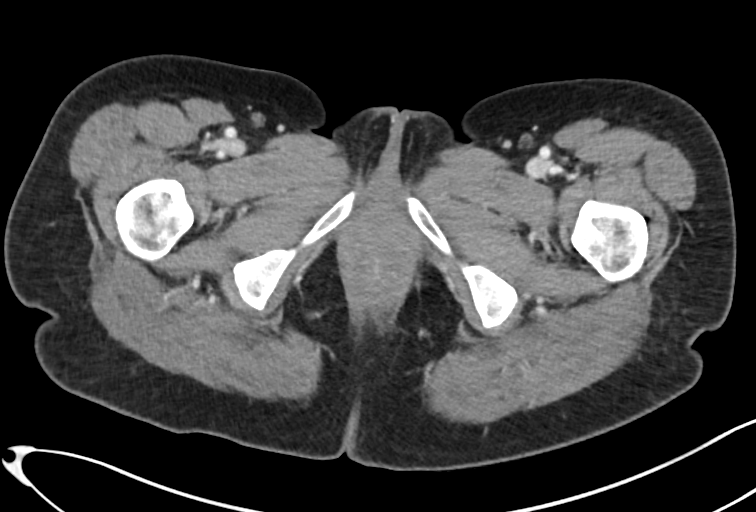
[im 25/84  soft-tissue]
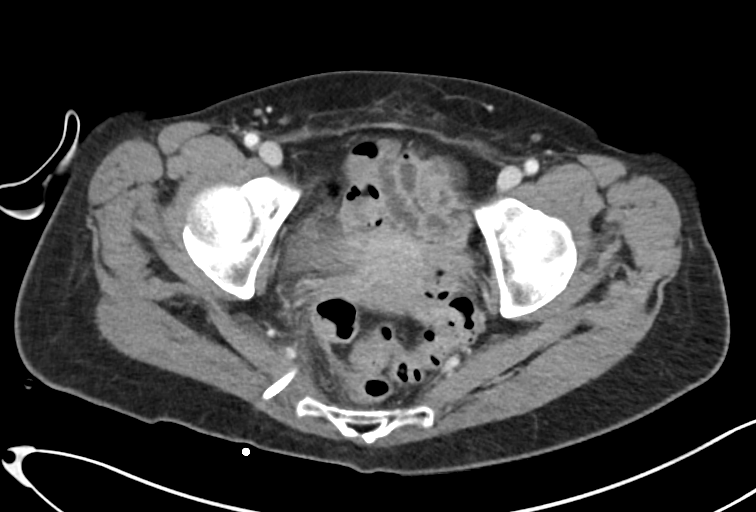
[im 32/84  soft-tissue]
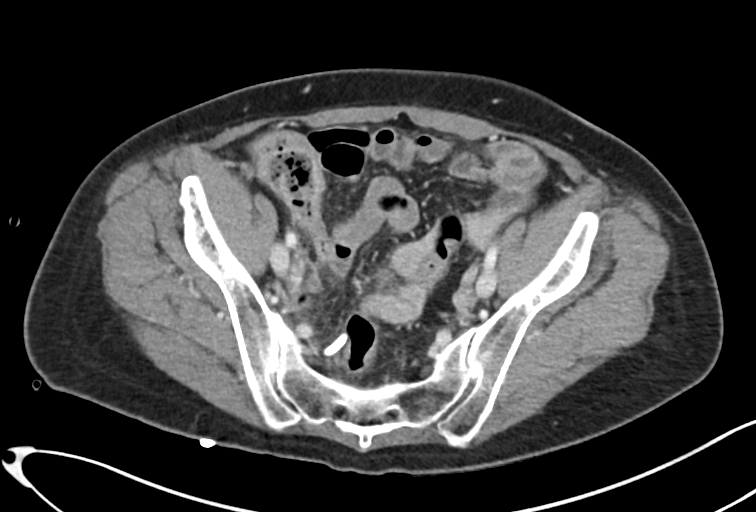
[im 42/84  soft-tissue]
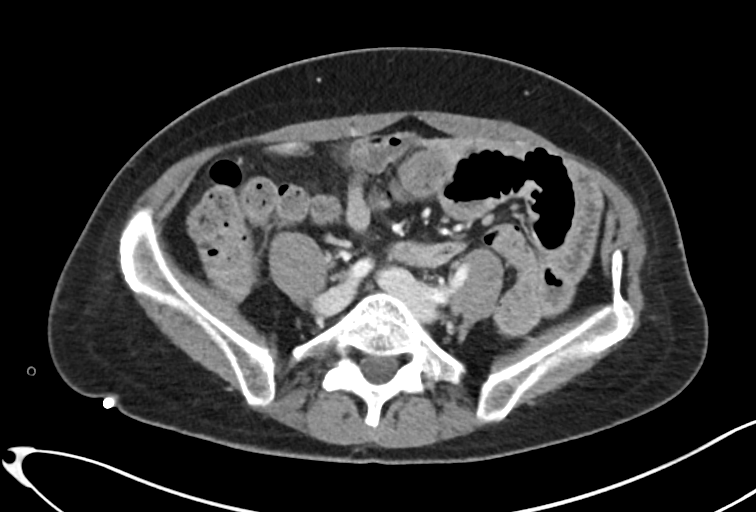
[im 52/84  soft-tissue]
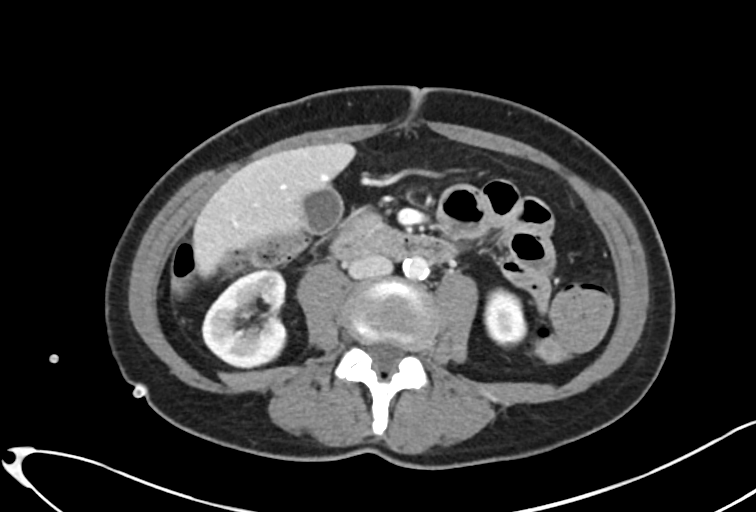
[im 59/84  soft-tissue]
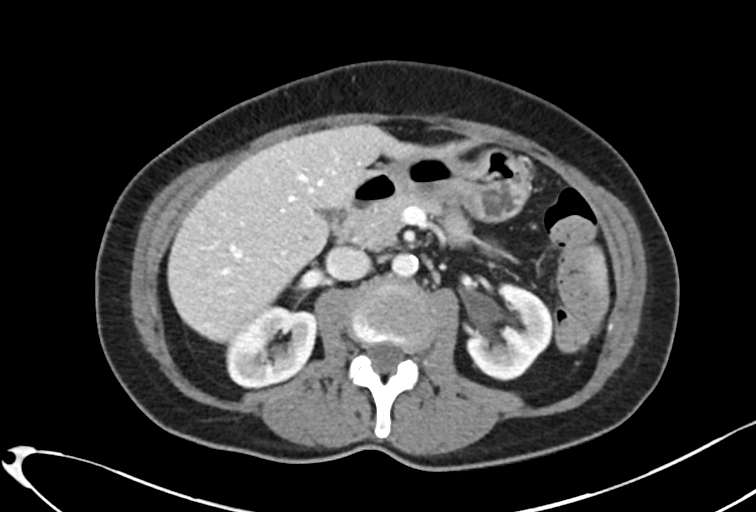
[im 70/84  soft-tissue]
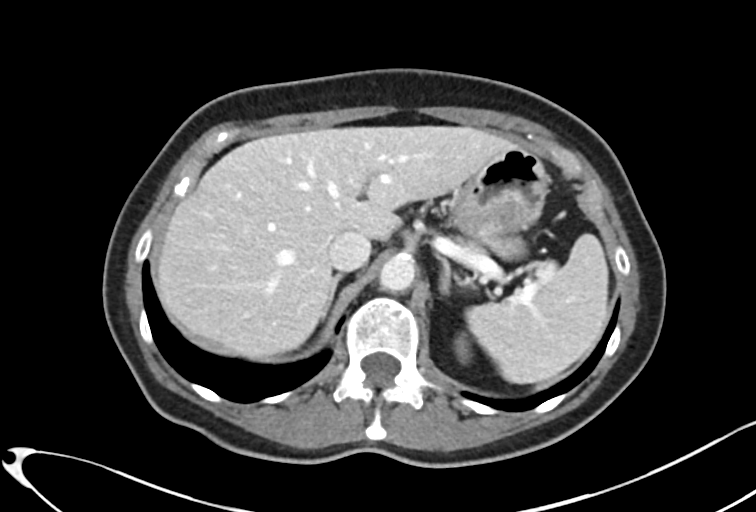
[im 77/84  soft-tissue]
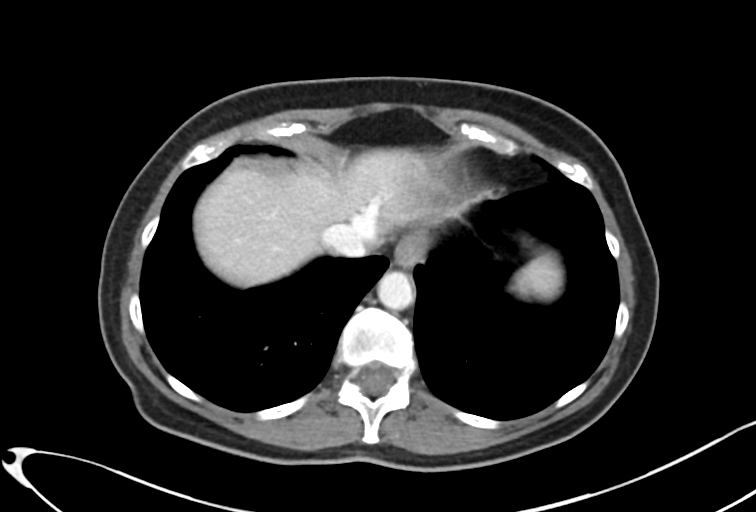
[im 77/84  bone]
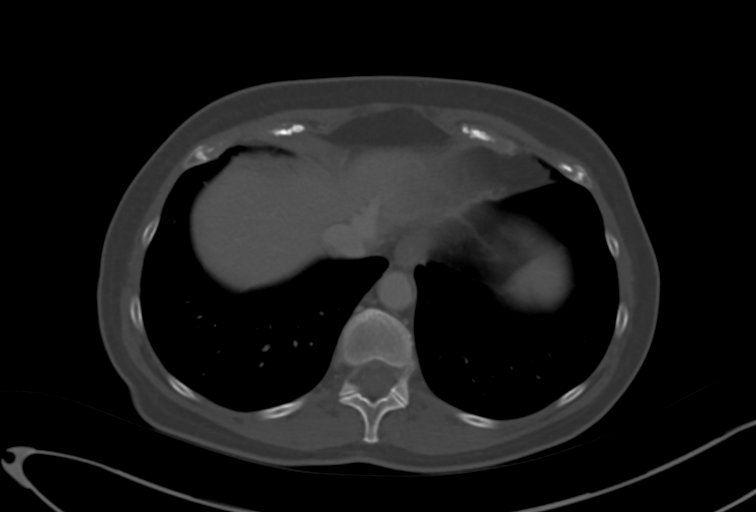

[Series 6: abd pelvis 2.00 br40 s3 cor · coronal · 0.80mm/px · 3 of 129 slices shown]
[im 43/129  soft-tissue]
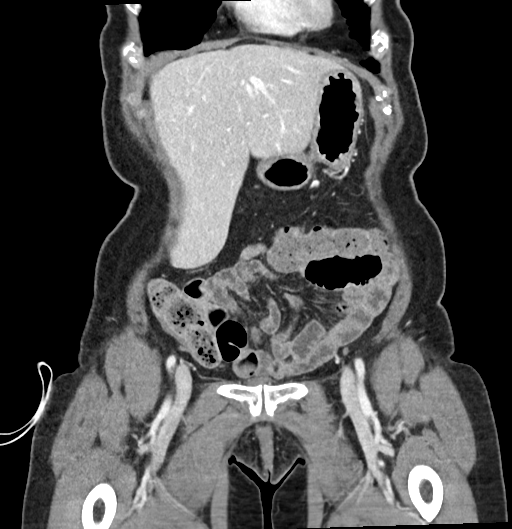
[im 57/129  soft-tissue]
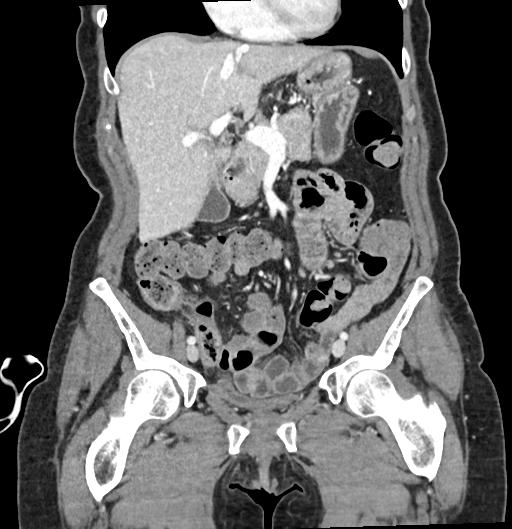
[im 72/129  soft-tissue]
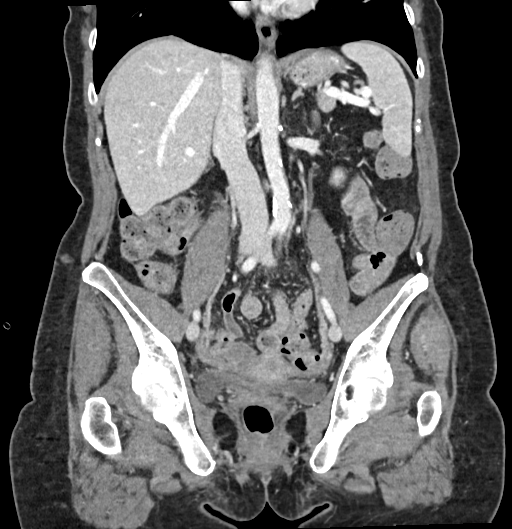

[12 of 46 positions shown; findings below may reference images not displayed]

RADIATION DOSE REDUCTION: This exam was performed according to the
departmental dose-optimization program which includes automated
exposure control, adjustment of the mA and/or kV according to
patient size and/or use of iterative reconstruction technique.

CONTRAST:  100mL IBT89B-K44 IOPAMIDOL (IBT89B-K44) INJECTION 61%
FINDINGS: Lower chest: No acute finding

Hepatobiliary: Unremarkable liver.  Unremarkable gallbladder.

Pancreas: Unremarkable

Spleen: Unremarkable

Adrenals/Urinary Tract: Unremarkable adrenal glands.

No hydronephrosis or nephrolithiasis.

Subcentimeter low-density lesions of the right kidney, not fully
characterized though most likely benign cysts.

Urinary bladder decompressed.

Stomach/Bowel: Unremarkable stomach.

Small bowel decompressed with no transition point. No focal wall
thickening or inflammatory changes. Interval resolution of the prior
bowel obstruction.

Moderate to large formed stool burden. Colonic diverticular disease
of the sigmoid colon. Appendix is not visualized.

Pigtail drainage catheter via the right transgluteal drain persist.
Complete resolution of inflammatory changes in fluid with no
residual abscess.

Vascular/Lymphatic: Minimal atherosclerotic changes. No aneurysm.
Bilateral iliac arteries and proximal femoral arteries patent.
Mesenteric and renal arteries patent. No adenopathy.

Reproductive: Unremarkable uterus/adnexa.

Other: None

Musculoskeletal: No acute displaced fracture
IMPRESSION: Unchanged position of right transgluteal pigtail drain, with
complete resolution of the prior inflammation/abscess.

No significant residual inflammatory changes of the small bowel

Aortic Atherosclerosis ([id number]).

## 2024-03-11 ENCOUNTER — Encounter: Payer: Self-pay | Admitting: Advanced Practice Midwife

## 2024-04-22 ENCOUNTER — Other Ambulatory Visit: Payer: Self-pay

## 2024-04-22 ENCOUNTER — Ambulatory Visit
Admission: EM | Admit: 2024-04-22 | Discharge: 2024-04-22 | Disposition: A | Discharge status: Home / Self Care | Attending: Family Medicine | Admitting: Family Medicine

## 2024-04-22 DIAGNOSIS — J209 Acute bronchitis, unspecified: Secondary | ICD-10-CM | POA: Diagnosis not present

## 2024-04-22 MED ORDER — AZITHROMYCIN 250 MG PO TABS: ORAL_TABLET | ORAL | 0 refills | Status: AC

## 2024-04-22 NOTE — ED Provider Notes (Signed)
 [facility]    CSN: [id number] Arrival date & time: 04/22/24  0921      History   Chief Complaint Chief Complaint  Patient presents with   Cough    HPI Alexis Holmes is a 73 y.o. female.   Patient spontaneously developed a cough about a month ago without preceding URI symptoms.  She denies history of allergies and allergic rhinitis.  She denies pleuritic pain, shortness of breath, wheezing, fever, GERD symptoms, and edema.  During the early stages of her cough [facility]  Sixteen days ago she visited her [facility] PCP where she had a normal exam and negative chest x-ray.  She was prescribed a trial of Xyzal and Flonase nasal spray which have not been helpful. Patient reports that her cough has become worse.  Initially her sputum was white or clear, but during the past 4-5 days it has become yellowish.  Yesterday she noticed a small amount of hemoptysis.  During the past week she has been more fatigued but does not feel ill.  The history is provided by the patient.  Cough Cough characteristics:  Productive Sputum characteristics:  Yellow Severity:  Moderate Onset quality:  Sudden Duration:  4 weeks Timing:  Intermittent Progression:  Worsening Chronicity:  Chronic Smoker: no   Context: not animal exposure, not exposure to allergens, not fumes, not occupational exposure, not sick contacts, not smoke exposure, not upper respiratory infection, not weather changes and not with activity   Relieved by:  Nothing Worsened by:  Nothing Associated symptoms: no chest pain, no chills, no diaphoresis, no ear fullness, no ear pain, no eye discharge, no fever, no headaches, no myalgias, no rash, no rhinorrhea, no shortness of breath, no sinus congestion, no sore throat, no weight loss and no wheezing   Risk factors: no recent infection and no recent travel     Past Medical History:  Diagnosis Date   Anemia    Anxiety    Arthritis     Colon polyps    Depression    History of kidney stones    Hypercholesterolemia    Intention tremor    Right   Menopause    Osteopenia    Vitamin D deficiency     Patient Active Problem List   Diagnosis Date Noted   Abnormal CT of the abdomen 02/26/2022   Ileitis 02/26/2022   History of colonic polyps 01/11/2022   Appendicitis 01/11/2022   Nausea & vomiting 01/11/2022   Constipation 01/11/2022   Other specified disorders of bone density and structure, unspecified site 01/11/2022   Pure hypercholesterolemia 01/11/2022   Sepsis due to undetermined organism (HCC) 01/09/2022   Ileitis, 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   BRONCHITIS, ACUTE 04/15/2011   Menopause 03/12/2011   Arthritis 03/12/2011   Other and unspecified hyperlipidemia 03/12/2011   Tremor 03/12/2011   Vitamin D deficiency 03/12/2011   Osteopenia 03/12/2011   Colon polyps 03/12/2011    Past Surgical History:  Procedure Laterality Date   COLONOSCOPY     EXTRACORPOREAL SHOCK WAVE LITHOTRIPSY Left 02/20/2020   Procedure: EXTRACORPOREAL SHOCK WAVE LITHOTRIPSY (ESWL);  Surgeon: Renda Glance, MD;  Location: [facility];  Service: Urology;  Laterality: Left;   IR RADIOLOGIST EVAL & MGMT  01/30/2022   LAPAROSCOPIC APPENDECTOMY N/A 04/10/2022   Procedure: APPENDECTOMY LAPAROSCOPIC, BILATERAL TAP BLOCK, LYSIS OF ADHESIONS;  Surgeon: Sheldon Standing, MD;  Location: [facility];  Service: General;  Laterality: N/A;   LEG SURGERY  [id number]   s/p fracture    OB History   No obstetric history on file.      Home Medications    Prior to Admission medications   Medication Sig Start Date End Date Taking? Authorizing Provider  azithromycin  (ZITHROMAX  Z-PAK) 250 MG tablet Take 2 tabs today; then begin one tab once daily for 4 more days. 04/22/24  Yes Pauline Garnette LABOR, MD  alendronate (FOSAMAX) 70 MG tablet Take 70 mg by mouth once a week. 02/26/21   [provider]  Cholecalciferol (VITAMIN D) 125 MCG (5000 UT)  CAPS Take 5,000 Units by mouth daily.    [provider]  DULoxetine (CYMBALTA) 20 MG capsule Take 20 mg by mouth daily.    [provider]  Omega-3 Fatty Acids (FISH OIL) 1000 MG CAPS Take 1,000 mg by mouth daily.    [provider]  SELENIUM PO Take 1 tablet by mouth daily.    [provider]  zinc gluconate 50 MG tablet Take 50 mg by mouth daily.    [provider]    Family History Family History  Problem Relation Age of Onset   Fibromyalgia Brother    Diabetes Maternal Grandfather    Breast cancer Maternal Aunt     Social History Social History   Tobacco Use   Smoking status: Never   Smokeless tobacco: Never  Vaping Use   Vaping status: Never Used  Substance Use Topics   Alcohol use: No   Drug use: No     Allergies   Patient has no known allergies.   Review of Systems Review of Systems  Constitutional:  Positive for activity change and fatigue. Negative for appetite change, chills, diaphoresis, fever and weight loss.  HENT:  Negative for ear pain, rhinorrhea and sore throat.   Eyes:  Negative for discharge.  Respiratory:  Positive for cough. Negative for chest tightness, shortness of breath and wheezing.   Cardiovascular:  Negative for chest pain and leg swelling.  Gastrointestinal: Negative.   Genitourinary: Negative.   Musculoskeletal:  Negative for myalgias.  Skin:  Negative for rash.  Neurological:  Negative for headaches.  Hematological:  Negative for adenopathy.     Physical Exam Triage Vital Signs ED Triage Vitals  Encounter Vitals Group     BP 04/22/24 0929 124/76     Girls Systolic BP Percentile --      Girls Diastolic BP Percentile --      Boys Systolic BP Percentile --      Boys Diastolic BP Percentile --      Pulse Rate 04/22/24 0929 72     Resp 04/22/24 0929 16     Temp 04/22/24 0929 98.1 F (36.7 C)     Temp src --      SpO2 04/22/24 0929 96 %     Weight --      Height --      Head  Circumference --      Peak Flow --      Pain Score 04/22/24 0932 0     Pain Loc --      Pain Education --      Exclude from Growth Chart --    No data found.  Updated Vital Signs BP 124/76   Pulse 72   Temp 98.1 F (36.7 C)   Resp 16   SpO2 96%   Visual Acuity Right Eye Distance:   Left Eye Distance:   Bilateral Distance:    Right Eye  Near:   Left Eye Near:    Bilateral Near:     Physical Exam Nursing notes and Vital Signs reviewed. Appearance:  Patient appears stated age, and in no acute distress Eyes:  Pupils are equal, round, and reactive to light and accomodation.  Extraocular movement is intact.  Conjunctivae are not inflamed  Nose:  Normal.  Pharynx:  Normal Neck:  Supple.  No adenopathy.  Lungs:  Clear to auscultation.  Breath sounds are equal.  Moving air well. Heart:  Regular rate and rhythm without murmurs, rubs, or gallops.  Abdomen:  Nontender without masses or hepatosplenomegaly.  Bowel sounds are present.  No CVA or flank tenderness.  Extremities:  No edema.  Skin:  No rash present.   UC Treatments / Results  Labs (all labs ordered are listed, but only abnormal results are displayed) Labs Reviewed - No data to display  EKG   Radiology No results found.  Procedures Procedures (including critical care time)  Medications Ordered in UC Medications - No data to display  Initial Impression / Assessment and Plan / UC Course  I have reviewed the triage vital signs and the nursing notes.  Pertinent labs & imaging results that were available during my care of the patient were reviewed by me and considered in my medical decision making (see chart for details).    Benign exam.  Etiology of cough not clear.  Will treat as a bronchitis.  Begin Z-pak. Followup with pulmonologist if cough persists.  Final Clinical Impressions(s) / UC Diagnoses   Final diagnoses:  Acute bronchitis, unspecified organism     Discharge Instructions      Take plain  guaifenesin (1200mg  extended release tabs such as Mucinex) twice daily, with plenty of water, for cough and congestion.   May take Delsym Cough Suppressant (12 Hour Cough Relief) at bedtime if cough becomes a problem at night.  If symptoms become significantly worse during the night or over the weekend, proceed to the local emergency room. .     ED Prescriptions     Medication Sig Dispense Auth. Provider   azithromycin  (ZITHROMAX  Z-PAK) 250 MG tablet Take 2 tabs today; then begin one tab once daily for 4 more days. 6 tablet Pauline Garnette LABOR, MD         Pauline Garnette LABOR, MD 04/22/24 [phone]

## 2024-04-22 NOTE — Discharge Instructions (Signed)
 Take plain guaifenesin (1200mg  extended release tabs such as Mucinex) twice daily, with plenty of water, for cough and congestion.   May take Delsym Cough Suppressant (12 Hour Cough Relief) at bedtime if cough becomes a problem at night.  If symptoms become significantly worse during the night or over the weekend, proceed to the local emergency room. Alexis Holmes

## 2024-04-22 NOTE — ED Triage Notes (Signed)
 Has c/o cough, congestion, dizziness for a month. Went to pcp on 13th and was given antihistamine which has not helped. Reports has coughing spells which are sometimes productive of yellow tinged sputum. Low energy. No fever. No otc meds.

## 2024-04-23 ENCOUNTER — Telehealth: Payer: Self-pay

## 2024-04-23 NOTE — Telephone Encounter (Signed)
 Called to check on patient, left voicemail.
